# Patient Record
Sex: Female | Born: 1970 | Race: White | Hispanic: No | Marital: Single | Smoking: Current every day smoker
Health system: Southern US, Community
[De-identification: ages and names within clinical notes are randomized; demographics above are authoritative.]

## PROBLEM LIST (undated history)

## (undated) DIAGNOSIS — F17200 Nicotine dependence, unspecified, uncomplicated: Secondary | ICD-10-CM

## (undated) DIAGNOSIS — S52501A Unspecified fracture of the lower end of right radius, initial encounter for closed fracture: Secondary | ICD-10-CM

## (undated) DIAGNOSIS — D071 Carcinoma in situ of vulva: Secondary | ICD-10-CM

## (undated) HISTORY — PX: WISDOM TOOTH EXTRACTION: SHX21

## (undated) HISTORY — PX: WRIST FRACTURE SURGERY: SHX121

## (undated) HISTORY — PX: BRONCHOSCOPY: SUR163

## (undated) HISTORY — PX: COLPOSCOPY: SHX161

## (undated) HISTORY — DX: Carcinoma in situ of vulva: D07.1

---

## 1999-03-02 ENCOUNTER — Other Ambulatory Visit: Admission: RE | Admit: 1999-03-02 | Discharge: 1999-03-02 | Payer: Self-pay | Admitting: *Deleted

## 2001-02-27 ENCOUNTER — Other Ambulatory Visit: Admission: RE | Admit: 2001-02-27 | Discharge: 2001-02-27 | Payer: Self-pay | Admitting: *Deleted

## 2003-02-23 ENCOUNTER — Other Ambulatory Visit: Admission: RE | Admit: 2003-02-23 | Discharge: 2003-02-23 | Payer: Self-pay | Admitting: Obstetrics and Gynecology

## 2004-03-31 ENCOUNTER — Other Ambulatory Visit: Admission: RE | Admit: 2004-03-31 | Discharge: 2004-03-31 | Payer: Self-pay | Admitting: Obstetrics and Gynecology

## 2006-12-14 ENCOUNTER — Ambulatory Visit (HOSPITAL_COMMUNITY): Admission: RE | Admit: 2006-12-14 | Discharge: 2006-12-14 | Payer: Self-pay | Admitting: Obstetrics & Gynecology

## 2007-12-06 ENCOUNTER — Encounter (INDEPENDENT_AMBULATORY_CARE_PROVIDER_SITE_OTHER): Payer: Self-pay | Admitting: Obstetrics & Gynecology

## 2007-12-06 ENCOUNTER — Ambulatory Visit (HOSPITAL_COMMUNITY): Admission: RE | Admit: 2007-12-06 | Discharge: 2007-12-06 | Payer: Self-pay | Admitting: Obstetrics & Gynecology

## 2010-02-23 ENCOUNTER — Encounter: Admission: RE | Admit: 2010-02-23 | Discharge: 2010-02-23 | Payer: Self-pay | Admitting: Family Medicine

## 2010-04-04 ENCOUNTER — Encounter: Admission: RE | Admit: 2010-04-04 | Discharge: 2010-04-04 | Payer: Self-pay | Admitting: Family Medicine

## 2010-04-05 ENCOUNTER — Encounter: Payer: Self-pay | Admitting: Internal Medicine

## 2010-04-12 ENCOUNTER — Encounter: Payer: Self-pay | Admitting: Internal Medicine

## 2010-04-18 ENCOUNTER — Encounter: Admission: RE | Admit: 2010-04-18 | Discharge: 2010-04-18 | Payer: Self-pay | Admitting: Family Medicine

## 2010-04-20 ENCOUNTER — Encounter: Payer: Self-pay | Admitting: Internal Medicine

## 2010-04-28 ENCOUNTER — Ambulatory Visit: Payer: Self-pay | Admitting: Internal Medicine

## 2010-04-28 DIAGNOSIS — R930 Abnormal findings on diagnostic imaging of skull and head, not elsewhere classified: Secondary | ICD-10-CM

## 2010-04-28 DIAGNOSIS — F17200 Nicotine dependence, unspecified, uncomplicated: Secondary | ICD-10-CM

## 2010-04-29 ENCOUNTER — Telehealth (INDEPENDENT_AMBULATORY_CARE_PROVIDER_SITE_OTHER): Payer: Self-pay | Admitting: *Deleted

## 2010-05-02 ENCOUNTER — Ambulatory Visit: Admission: RE | Admit: 2010-05-02 | Discharge: 2010-05-02 | Payer: Self-pay | Admitting: Internal Medicine

## 2010-05-03 ENCOUNTER — Telehealth (INDEPENDENT_AMBULATORY_CARE_PROVIDER_SITE_OTHER): Payer: Self-pay | Admitting: *Deleted

## 2010-05-05 ENCOUNTER — Ambulatory Visit: Payer: Self-pay | Admitting: Internal Medicine

## 2010-06-08 ENCOUNTER — Ambulatory Visit: Payer: Self-pay | Admitting: Internal Medicine

## 2010-11-22 ENCOUNTER — Other Ambulatory Visit: Payer: Self-pay | Admitting: Obstetrics & Gynecology

## 2010-11-22 NOTE — Letter (Signed)
Summary: Carolin Coy MD  Carolin Coy MD   Imported By: Sherian Rein 05/18/2010 09:42:19  _____________________________________________________________________  External Attachment:    Type:   Image     Comment:   External Document

## 2010-11-22 NOTE — Progress Notes (Signed)
Summary: cd  Phone Note Call from Patient Call back at Home Phone [phone]   Caller: Patient Call For: ramaswamy Summary of Call: pt wants to make sure that MR has her cd that she brought in (tests) says she doesn't need it, just wants to make sure that it was given to him.   Initial call taken by: Tivis Ringer, CNA,  May 03, 2010 2:23 PM  Follow-up for Phone Call        Please advise, thanks! Vernie Murders  May 03, 2010 2:57 PM   Additional Follow-up for Phone Call Additional follow up Details #1::        I gave CD to [facility] corodinator Recardo Evangelist has given to radiologyfor discussion on thursday. AFter that I should get it and then returnto patient Additional Follow-up by: Kalman Shan MD,  May 03, 2010 3:05 PM    Additional Follow-up for Phone Call Additional follow up Details #2::    Spoke with pt and advised of the above recs per MR.  Pt verbalized understanding. Follow-up by: Vernie Murders,  May 03, 2010 3:15 PM

## 2010-11-22 NOTE — Assessment & Plan Note (Signed)
Summary: pneumonia/jd   Visit Type:  Initial Consult Copy to:  Dr. Gerri Spore Primary Provider/Referring Provider:  Dr. Carolin Coy  CC:  Pulmonary consult. The patient c/o recurrent pneumonia. Brought DR chest on CD. No complaints today.Marland Kitchen  History of Present Illness: IOV 77/2011: 40 year old smoker. Went to [place] for 4 nights  and returned 02/07/2010. Reports alcohol binge  consumption but no loss of consciousness or aspiration. On 02/11/2010 was stretching arms as part of exercise and felt a sudden pull with severe pain in left infrascapular region. Took alleve and pain improved but was still having some "hurting" when lying on side and also deep breath. then around 02/14/2010 as pain startred improved developed a "hacking" dry severe cough. Because symptoms persisted saw PMD 02/23/2010. CXR showed Left Lower Lobe/Lingular infiltrate. Diagnosed as pneumonia. Was given antibiotics nos. Followed with PMD a week later. Reportedly exam showed some tightness. So, given another round of antibiotics. With this she improved by 03/02/2010 and felt nearly normal. She then went on vacation to [place], [place] over memorial day weekend. Felt fine. Went for followup on6/13 and CXR showed worsening Left Lower Lobe/Lingular infilrate. But asymptomatic at thhis time. Had CT chest 6/14 at [facility]. I have reviewd this. Official reports suggests consolidation of left lower lobe with small loculated effusion  (my impression see lab section). She was then placed on levaquin. Still asymptomatic. ESR on 6/21 hight at 81.  Followup CXR 6/27 showed progressive worsening of  infiltrate in lung.  Repeat ESR 6/29 was high 65 and WC 13k. Therefore referred here.   Currently feels fine but anxious about diagnosis. States she has very minimal to no chest symptoms other than very rare cough. No fever. No weight loss. No diaphoresis. Only has few chills very rarely. Going to work.  She has now quit smoking 3 days ago on  04/26/2010  Preventive Screening-Counseling & Management  Alcohol-Tobacco     Alcohol drinks/day: <1     Smoking Status: quit     Smoking Cessation Counseling: no     Smoke Cessation Stage: quit     Packs/Day: 1.0     Year Started: 1991     Year Quit: 2011     Pack years: 20     Tobacco Counseling: not to resume use of tobacco products  Comments: quit 04/26/2010 RX  wellbutrin 04/28/2010  Current Medications (verified): 1)  Depo-Provera 150 Mg/ml Susp (Medroxyprogesterone Acetate) .Marland Kitchen.. 1 Injection Every 3 Months 2)  Allegra-D 12 Hour 60-120 Mg Xr12h-Tab (Fexofenadine-Pseudoephedrine) .Marland Kitchen.. 1 By Mouth Two Times A Day As Needed  Allergies (verified): No Known Drug Allergies  Past History:  Past Medical History: #Denies DM, BP, Cancer, STroke, MI, CAD. #Snores but denies excess day time somnolence #BMI 31-32 #Tobaccao Abuse #tb SKIN TEST NEGATIVE - 6/21  Past Surgical History:  2009 - Colposcopy of cervix and vulva with biopsy of cervix at 6   and 9 o'clock proceed the and carbon dioxide laser fulguration of vulvar   lesions for Recurrent vulvar intraepithelial neoplasia III.   Family History: Family History Lung Cancer---MGM Family History MI/Heart Attack---MGF Family History Emphysema ---MGM Leukemia---PGF Thyroid cancer---father  Social History: Patient states former smoker. 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 1 dog at home No birds or catsAlcohol drinks/day:  <1 Smoking Status:  quit Packs/Day:  1.0 Pack years:  20  Review of Systems  The patient complains of shortness of breath with activity, non-productive cough, headaches, and nasal congestion/difficulty breathing through nose.  The patient denies shortness of breath at rest, productive cough, coughing up blood, chest pain, irregular heartbeats, acid heartburn, indigestion, loss of appetite,  weight change, abdominal pain, difficulty swallowing, sore throat, tooth/dental problems, sneezing, itching, ear ache, anxiety, depression, hand/feet swelling, joint stiffness or pain, rash, change in color of mucus, and fever.    Vital Signs:  Patient profile:   40 year old female Height:      68 inches (172.72 cm) Weight:      209 pounds (95.00 kg) BMI:     31.89 O2 Sat:      96 % on Room air Temp:     98.2 degrees F (36.78 degrees C) oral Pulse rate:   101 / minute BP sitting:   112 / 72  (right arm) Cuff size:   regular  Vitals Entered By: Michel Bickers CMA (April 28, 2010 4:01 PM)  O2 Sat at Rest %:  96 O2 Flow:  Room air CC: Pulmonary consult. The patient c/o recurrent pneumonia. Brought DR chest on CD. No complaints today. Comments Medications reviewed. Daytime phone verified. Michel Bickers CMA  April 28, 2010 4:01 PM   Physical Exam  General:  well developed, well nourished, in no acute distress Head:  normocephalic and atraumatic Eyes:  PERRLA/EOM intact; conjunctiva and sclera clear Ears:  TMs intact and clear with normal canals Nose:  no deformity, discharge, inflammation, or lesions Mouth:  no deformity or lesions Neck:  no masses, thyromegaly, or abnormal cervical nodes Chest Wall:  no deformities noted Lungs:  clear bilaterally to auscultation and percussion Heart:  regular rate and rhythm, S1, S2 without murmurs, rubs, gallops, or clicks Abdomen:  bowel sounds positive; abdomen soft and non-tender without masses, or organomegaly Msk:  no deformity or scoliosis noted with normal posture Pulses:  pulses normal Extremities:  no clubbing, cyanosis, edema, or deformity noted Neurologic:  CN II-XII grossly intact with normal reflexes, coordination, muscle strength and tone Skin:  intact without lesions or rashes Cervical Nodes:  no significant adenopathy Axillary Nodes:  no significant adenopathy Psych:  alert and cooperative; normal mood and affect; normal attention  span and concentration   EKG  Procedure date:  02/23/2010  Findings:        Clinical Data: Left-sided chest pain with cough and congestion,   smoking history    CHEST - 2 VIEW    Comparison: None    Findings: There is opacity at the left lung base most consistent   with pneumonia and possible effusion.  Some of this effusion may be   tracking into the major fissure.  Follow-up chest x-ray is   recommended to ensure clearing.  The right lung is well aerated.   The heart is within normal limits in size.  No mediastinal   abnormality is seen.  No bony abnormality is noted.    IMPRESSION:   Opacity at the left lung base most consistent with pneumonia and   left effusion.  Recommend follow-up to ensure clearing.    Read By:  Juline Patch,  M.D.   Released By:  Juline Patch,  M.D.   Comments:      personally reviewed  CXR  Procedure date:  04/18/2010  Findings:       Clinical Data: Low lung infiltrate. The patient reports a recent CT   scan was negative for tumor.  CHEST - 2 VIEW    Comparison: 04/04/2010,    Findings: Again noted is a prominent density in the lingula of the   left lung.  There is pleural thickening.  The extent of the density   has slightly increased with some fluid tracking along the major   fissure.    The right lung is clear.    Heart size and vascularity are normal.    IMPRESSION:   Progressive poorly defined abnormality in the lingula of the left   lung.  This may represent pneumonia.    Read By:  Gwynn Burly,  M.D.   Released By:  Gwynn Burly,  M.D.   Comments:      independently reviewed  CT of Chest  Procedure date:  04/05/2010  Findings:      CT =chest done in [facility]. No report. In lingula theere is parencyhmal triangular density with apex medially and with airbronchogram at apex. Distally near chest wall there is a different density to it. Findings are suggesitve of collapse/consolidation jn my  opinion  Impression & Recommendations:  Problem # 1:  NONSPCIFC ABN FINDING RAD & OTH EXAM LUNG FIELD (ICD-793.1) Assessment New Following trip to [place] in mid-april 2011 she appears to have progressive Left Lower Lobe wedge shaped consolidative process with airbronchograms. THere might be a pleural component as reported by 6/27 CT scan radiologist but I feel she predmoninantly has parenchymal process with differeing densitieis. Diferential diagnosis includes collapse due to foregin body aspiration (? sustained in [place]) versus endobronchial lesion (low pretest prob), chronic lung abscess, BOOP.  Today's CXR does not show any change from 6/27  PLAN I think first step to proceed with bronch for airway exam to rule out endobronchial issues. Wil do lavage as well to assess for cell count, microbiology. She has been advised of risks of bronch (sedaiton, bleeding, pneumothorax) and limitation (non diagnosis). SHe is wiling to proceed. Depending on bronch results, we might have to have CVTS evaluate her Orders: T-2 View CXR (71020TC) Consultation Level V (78938) Tobacco use cessation intensive >10 minutes (10175)  Problem # 2:  TOBACCO ABUSE (ICD-305.1) Assessment: New  She quit only 2 days ago. Pripor effort at quitting failed. Currently having withdrawals. REluctant to try chantix due to bad press. Reluctantly willing to try wellbutrin. I advised her of potential benefit and have given side effect profile about weelbutrin. She has taken script for it and will try wellbutrin  > 11 minutes counselling  Orders: Consultation Level V (10258) Tobacco use cessation intensive >10 minutes (52778)  Medications Added to Medication List This Visit: 1)  Depo-provera 150 Mg/ml Susp (Medroxyprogesterone acetate) .Marland Kitchen.. 1 injection every 3 months 2)  Allegra-d 12 Hour 60-120 Mg Xr12h-tab (Fexofenadine-pseudoephedrine) .Marland Kitchen.. 1 by mouth two times a day as needed 3)  Wellbutrin Sr 150 Mg Xr12h-tab (Bupropion hcl)  .... Take 1 tablet daily for 3 days and then take 1 tablet two times a day to continue  Patient Instructions: 1)  Please take wellbutrin for quitting smoking as directed 2)  Read the side effect profile on wellbutrin 3)  I can do bronchoscopy on  4)   a) monday 7/11 at 12:30pm 5)   b) tuesday or wed 7/12 and 7/13 9:30-12:30 but my ofice will have to block out 45 minutes of my office time for that 6)  Please wait to hear from office on 04/29/2010 about bronch schedule Prescriptions: WELLBUTRIN SR 150 MG XR12H-TAB (BUPROPION HCL) TAKE 1 TABLET  DAILY FOR 3 DAYS AND THEN TAKE 1 TABLET two times a day TO CONTINUE  #60 x 1   Entered and Authorized by:   Kalman Shan MD   Signed by:   Kalman Shan MD on 04/28/2010   Method used:   Print then Give to Patient   RxID:   [id number]

## 2010-11-22 NOTE — Assessment & Plan Note (Signed)
Summary: 1 MONTH/CB   Visit Type:  Follow-up Copy to:  Dr. Gerri Spore Primary Provider/Referring Provider:  Dr. Carolin Coy  CC:  pt here for 4 week follow-up. Margaret Perez  History of Present Illness: IOV 77/2011: 40 year old smoker. Went to [place] for 4 nights  and returned 02/07/2010. Reports alcohol binge  consumption but no loss of consciousness or aspiration. On 02/11/2010 was stretching arms as part of exercise and felt a sudden pull with severe pain in left infrascapular region. Took alleve and pain improved but was still having some "hurting" when lying on side and also deep breath. then around 02/14/2010 as pain startred improved developed a "hacking" dry severe cough. Because symptoms persisted saw PMD 02/23/2010. CXR showed Left Lower Lobe/Lingular infiltrate. Diagnosed as pneumonia. Was given antibiotics nos. Followed with PMD a week later. Reportedly exam showed some tightness. So, given another round of antibiotics. With this she improved by 03/02/2010 and felt nearly normal. She then went on vacation to [place], [place] over memorial day weekend. Felt fine. Went for followup on6/13 and CXR showed worsening Left Lower Lobe/Lingular infilrate. But asymptomatic at thhis time. Had CT chest 6/14 at [facility]. I have reviewd this. Official reports suggests consolidation of left lower lobe with small loculated effusion  (my impression see lab section). She was then placed on levaquin. Still asymptomatic. ESR on 6/21 hight at 81.  Followup CXR 6/27 showed progressive worsening of  infiltrate in lung.  Repeat ESR 6/29 was high 65 and WC 13k. Therefore referred here.   Currently feels fine but anxious about diagnosis. States she has very minimal to no chest symptoms other than very rare cough. No fever. No weight loss. No diaphoresis. Only has few chills very rarely. Going to work.  She has now quit smoking 3 days ago on 04/26/2010  REC: BRONCH   #OV 05/05/2010: Followup. Is s/p bronch 05/02/2010. Had Left  Lowe lobe BAL, Brush, and TBBx along wiht airway exam . All non-diagnostic. REsults reviewed at [facility] conference this morning. 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 Watch and wait approach suggested. She is here to review results. No symptoms since last visit. In terms of smoking, she has relapsed. She smoked today. She is taking wellbutrin but has not set a quit date. REC: 3 week augmentin  #PV 06/08/2010: Followup for LLL consolidation/lung abscess and tobacco abuse. She finished 3 week agumentin course  1 week ago. Tolerated it well. Still has sensation of tightness in left chest infra-axillary region with deep inspiration. She insists this is not a pain. Feels well otherwise. Denies active complaints.Still smokng though and is struggling to quit; wants to continue wellbutrin   Preventive Screening-Counseling & Management  Alcohol-Tobacco     Smoking Status: current     Smoking Cessation Counseling: yes     Smoke Cessation Stage: contemplative     Tobacco Counseling: to quit use of tobacco products  Comments: continue wellbutrin. advised to start patch.   Current Medications (verified): 1)  Depo-Provera 150 Mg/ml Susp (Medroxyprogesterone Acetate) .Margaret Perez.. 1 Injection Every 3 Months 2)  Allegra-D 12 Hour 60-120 Mg Xr12h-Tab (Fexofenadine-Pseudoephedrine) .Margaret Perez.. 1 By Mouth Two Times A Day As Needed 3)  Wellbutrin Sr 150 Mg Xr12h-Tab (Bupropion Hcl) .... Take 1 Tablet By Mouth Once A Day  Allergies (verified): No Known Drug Allergies  Past History:  Past medical, surgical, family and social histories (including risk factors) reviewed, and no changes noted (except as noted below).  Past Medical History: Reviewed history from 04/28/2010 and  no changes required. #Denies DM, BP, Cancer, STroke, MI, CAD. #Snores but denies excess day time somnolence #BMI 31-32 #[place] Abuse #tb SKIN TEST NEGATIVE - 6/21  Past Surgical History: Reviewed history from 04/28/2010 and no changes  required.  2009 - Colposcopy of cervix and vulva with biopsy of cervix at 6   and 9 o'clock proceed the and carbon dioxide laser fulguration of vulvar   lesions for Recurrent vulvar intraepithelial neoplasia III.   Family History: Reviewed history from 04/28/2010 and no changes required. Family History Lung Cancer---MGM Family History MI/Heart Attack---MGF Family History Emphysema ---MGM Leukemia---PGF Thyroid cancer---father  Social History: Reviewed history from 04/28/2010 and no changes required. Patient states former smoker. Quit 04/26/2010. Started back smoking aug 1st.  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 1 dog at home No birds or cats  Review of Systems  The patient denies shortness of breath with activity, shortness of breath at rest, productive cough, non-productive cough, coughing up blood, chest pain, irregular heartbeats, acid heartburn, indigestion, loss of appetite, weight change, abdominal pain, difficulty swallowing, sore throat, tooth/dental problems, headaches, nasal congestion/difficulty breathing through nose, sneezing, itching, ear ache, anxiety, depression, hand/feet swelling, joint stiffness or pain, rash, change in color of mucus, and fever.    Vital Signs:  Patient profile:   40 year old female Height:      68 inches Weight:      210.38 pounds O2 Sat:      98 % on Room air Temp:     98.0 degrees F oral Pulse rate:   101 / minute BP sitting:   136 / 90  (right arm) Cuff size:   regular  Vitals Entered By: Carron Curie CMA (June 08, 2010 11:50 AM)  O2 Flow:  Room air CC: pt here for 4 week follow-up.  Comments Medications reviewed with patient .sgin Daytime phone number verified with patient.    Physical Exam  General:  well developed, well nourished, in no acute distress Head:  normocephalic and atraumatic Eyes:  PERRLA/EOM intact;  conjunctiva and sclera clear Ears:  TMs intact and clear with normal canals Nose:  no deformity, discharge, inflammation, or lesions Mouth:  no deformity or lesions Neck:  no masses, thyromegaly, or abnormal cervical nodes Chest Wall:  no deformities noted Lungs:  clear bilaterally to auscultation and percussion Heart:  regular rate and rhythm, S1, S2 without murmurs, rubs, gallops, or clicks Abdomen:  bowel sounds positive; abdomen soft and non-tender without masses, or organomegaly Msk:  no deformity or scoliosis noted with normal posture Pulses:  pulses normal Extremities:  no clubbing, cyanosis, edema, or deformity noted Neurologic:  CN II-XII grossly intact with normal reflexes, coordination, muscle strength and tone Skin:  intact without lesions or rashes Cervical Nodes:  no significant adenopathy Axillary Nodes:  no significant adenopathy Psych:  alert and cooperative; normal mood and affect; normal attention span and concentration   CXR  Procedure date:  06/08/2010  Findings:      significant clearnce of LLL consolidative process since earlyjuly 2011  Comments:      independent review of film. official report pending  Impression & Recommendations:  Problem # 1:  NONSPCIFC ABN FINDING RAD & OTH EXAM LUNG FIELD (ICD-793.1) Assessment Improved Significant clearing of LLL lung abscess on cxr. s/p 3 week augmentin Rx july - aug 2011.  plan repeat cxr 8 weeks to ensure complete clearance Orders: T-2 View  CXR (71020TC) Est. Patient Level III (13086)  Problem # 2:  TOBACCO ABUSE (ICD-305.1) Assessment: Unchanged  She has relapsed into smoking. Counselled her again about quitting. She is already on wellbutrin. She wants to conitnue it. She wants something additional ADvised nicotine patch.  She verbalized understanding and has promised to set quit date and quit smoking this week She refused joining quit smoking classes  5 minutes counselling  Orders: Est. Patient  Level III (57846) Tobacco use cessation intermediate 3-10 minutes (96295)  Medications Added to Medication List This Visit: 1)  Wellbutrin Sr 150 Mg Xr12h-tab (Bupropion hcl) .... Take 1 tablet by mouth bid  Patient Instructions: 1)  #Pneumonia 2)  - your xray is better 3)  #SMOKING 4)    - you got to quit 5)   - continue wellbutrin 6)    - start otc patch nictoine 7)  #FOllowup 8)   - return 8 weeks with CXR 9)   - come sooner if there are problems Prescriptions: WELLBUTRIN SR 150 MG XR12H-TAB (BUPROPION HCL) Take 1 tablet by mouth bid  #60 x 2   Entered and Authorized by:   Kalman Shan MD   Signed by:   Kalman Shan MD on 06/08/2010   Method used:   Electronically to        [facility]  [phone]* (retail)       [address] [place], [place]  [address]       Ph: [phone] or [phone]       Fax: [phone]   RxID:   [id number]

## 2010-11-22 NOTE — Progress Notes (Signed)
Summary: Bronchoscopy scheduled  Phone Note Outgoing Call   Call placed by: Michel Bickers CMA,  April 29, 2010 9:37 AM Call placed to: Patient Summary of Call: Bronchoscopy is scheduled for Monday, July 11th @ 12:30pm. The patient was instructed to arrive @ [facility] admiting by 11:15am, NPO after midnight and someone will need to come with her to drive her home. The patient had verbalized understanding of all instructions. Dr. Marchelle Gearing was given date and time of procedure. Initial call taken by: Michel Bickers CMA,  April 29, 2010 9:39 AM

## 2010-11-22 NOTE — Assessment & Plan Note (Signed)
Summary: Margaret Perez   Visit Type:  Follow-up Copy to:  Dr. Gerri Spore Primary Provider/Referring Provider:  Dr. Carolin Coy   History of Present Illness: IOV 77/2011: 40 year old smoker. Went to [place] for 4 nights  and returned 02/07/2010. Reports alcohol binge  consumption but no loss of consciousness or aspiration. On 02/11/2010 was stretching arms as part of exercise and felt a sudden pull with severe pain in left infrascapular region. Took alleve and pain improved but was still having some "hurting" when lying on side and also deep breath. then around 02/14/2010 as pain startred improved developed a "hacking" dry severe cough. Because symptoms persisted saw PMD 02/23/2010. CXR showed Left Lower Lobe/Lingular infiltrate. Diagnosed as pneumonia. Was given antibiotics nos. Followed with PMD a week later. Reportedly exam showed some tightness. So, given another round of antibiotics. With this she improved by 03/02/2010 and felt nearly normal. She then went on vacation to [place], [place] over memorial day weekend. Felt fine. Went for followup on6/13 and CXR showed worsening Left Lower Lobe/Lingular infilrate. But asymptomatic at thhis time. Had CT chest 6/14 at [facility]. I have reviewd this. Official reports suggests consolidation of left lower lobe with small loculated effusion  (my impression see lab section). She was then placed on levaquin. Still asymptomatic. ESR on 6/21 hight at 81.  Followup CXR 6/27 showed progressive worsening of  infiltrate in lung.  Repeat ESR 6/29 was high 65 and WC 13k. Therefore referred here.   Currently feels fine but anxious about diagnosis. States she has very minimal to no chest symptoms other than very rare cough. No fever. No weight loss. No diaphoresis. Only has few chills very rarely. Going to work.  She has now quit smoking 3 days ago on 04/26/2010  REC: BRONCHOV 05/05/2010: Followup. Is s/p bronch 05/02/2010. Had Left Lowe lobe BAL, Brush, and TBBx along wiht  airway exam . All non-diagnostic. REsults reviewed at [facility] conference this morning. 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 Watch and wait approach suggested. She is here to review results. No symptoms since last visit. In terms of smoking, she has relapsed. She smoked today. She is taking wellbutrin but has not set a quit date.   Preventive Screening-Counseling & Management  Alcohol-Tobacco     Alcohol drinks/day: <1     Smoking Status: current     Smoking Cessation Counseling: no     Smoke Cessation Stage: relapse     Packs/Day: 1.0     Year Started: 1991     Year Quit: 2011     Pack years: 20     Tobacco Counseling: to quit use of tobacco products  Comments: relapsed this week. ADvised to contnue wellbutrin started last week but set a quit date for quitting smoking  Current Medications (verified): 1)  Depo-Provera 150 Mg/ml Susp (Medroxyprogesterone Acetate) .Marland Kitchen.. 1 Injection Every 3 Months 2)  Allegra-D 12 Hour 60-120 Mg Xr12h-Tab (Fexofenadine-Pseudoephedrine) .Marland Kitchen.. 1 By Mouth Two Times A Day As Needed 3)  Wellbutrin Sr 150 Mg Xr12h-Tab (Bupropion Hcl) .... Take 1 Tablet Daily For 3 Days and Then Take 1 Tablet Two Times A Day To Continue  Allergies (verified): No Known Drug Allergies  Past History:  Past Medical History: Last updated: 04/28/2010 #Denies DM, BP, Cancer, STroke, MI, CAD. #Snores but denies excess day time somnolence #BMI 31-32 #[place] Abuse #tb SKIN TEST NEGATIVE - 6/21  Past Surgical History: Last updated: 04/28/2010  2009 - Colposcopy of cervix and vulva with biopsy of cervix at  6   and 9 o'clock proceed the and carbon dioxide laser fulguration of vulvar   lesions for Recurrent vulvar intraepithelial neoplasia III.   Family History: Last updated: 04/28/2010 Family History Lung Cancer---MGM Family History MI/Heart Attack---MGF Family History Emphysema ---MGM Leukemia---PGF Thyroid cancer---father  Social History: Last updated:  04/28/2010 Patient states former smoker. 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 1 dog at home No birds or cats  Risk Factors: Alcohol Use: <1 (05/05/2010)  Risk Factors: Smoking Status: current (05/05/2010) Packs/Day: 1.0 (05/05/2010)  Family History: Reviewed history from 04/28/2010 and no changes required. Family History Lung Cancer---MGM Family History MI/Heart Attack---MGF Family History Emphysema ---MGM Leukemia---PGF Thyroid cancer---father  Social History: Reviewed history from 04/28/2010 and no changes required. Patient states former smoker. 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 1 dog at home No birds or catsSmoking Status:  current  Review of Systems  The patient denies anorexia, fever, weight loss, weight gain, vision loss, decreased hearing, hoarseness, chest pain, syncope, dyspnea on exertion, peripheral edema, prolonged cough, headaches, hemoptysis, abdominal pain, melena, hematochezia, severe indigestion/heartburn, hematuria, incontinence, genital sores, muscle weakness, suspicious skin lesions, transient blindness, difficulty walking, depression, unusual weight change, abnormal bleeding, enlarged lymph nodes, angioedema, breast masses, and testicular masses.    Vital Signs:  Patient profile:   40 year old female O2 Sat:      97 % on Room air Pulse rate:   106 / minute Resp:     20 per minute BP sitting:   138 / 85  O2 Flow:  Room air  Physical Exam  General:  well developed, well nourished, in no acute distress Head:  normocephalic and atraumatic Eyes:  PERRLA/EOM intact; conjunctiva and sclera clear Ears:  TMs intact and clear with normal canals Nose:  no deformity, discharge, inflammation, or lesions Mouth:  no deformity or  lesions Neck:  no masses, thyromegaly, or abnormal cervical nodes Chest Wall:  no deformities noted Lungs:  clear bilaterally to auscultation and percussion Heart:  regular rate and rhythm, S1, S2 without murmurs, rubs, gallops, or clicks Abdomen:  bowel sounds positive; abdomen soft and non-tender without masses, or organomegaly Msk:  no deformity or scoliosis noted with normal posture Pulses:  pulses normal Extremities:  no clubbing, cyanosis, edema, or deformity noted Neurologic:  CN II-XII grossly intact with normal reflexes, coordination, muscle strength and tone Skin:  intact without lesions or rashes Cervical Nodes:  no significant adenopathy Axillary Nodes:  no significant adenopathy Psych:  alert and cooperative; normal mood and affect; normal attention span and concentration   MISC. Report  Procedure date:  05/02/2010  Findings:      bronch LLL - BAL, Brush and Tbbx  - nondiagnostic. All suggestive of inflammatory cells  Impression & Recommendations:  Problem # 1:  TOBACCO ABUSE (ICD-305.1) Assessment Deteriorated  She has relapsed into smoking. Counselled her again about quitting. She is already on wellbutrin. INformed her that healing of abscess will be complicated. She verbalized understanding and has promised to set quit date and quit smoking this week  5 minutes counselling  Orders: Est. Patient Level IV (16109)  Problem # 2:  NONSPCIFC ABN FINDING RAD & OTH EXAM LUNG FIELD (ICD-793.1) Assessment: Unchanged  Following trip to [place] in mid-april 2011 she appears to have progressive Left Lower Lobe wedge  shaped consolidative process with airbronchograms. THere might be a pleural component as reported by 6/27 CT scan radiologist but I feel she predmoninantly has parenchymal process with differeing densitieis. Diferential diagnosis includes collapse due to foregin body aspiration (? sustained in [place]) versus endobronchial lesion (low pretest prob), chronic lung  abscess, BOOP.  Today's CXR does not show any change from 6/27  s.p BRONCH 05/02/2010 - BAL,. TTBx and brush - Nondiagnostic  Margaret discussion 05/05/2010 - cw abscess. REcommend conservative care  PLAN Augmentin 875mg  by mouth two times a day x 3 weeks (Reviewed - she has no allergies to PCN.Risk of C Diff diarrhea warned) Followup 4 weeks with CXR at pulmonary office  Orders: Est. Patient Level IV (16109) Prescription Created Electronically [phone])  Medications Added to Medication List This Visit: 1)  Augmentin 875-125 Mg Tabs (Amoxicillin-pot clavulanate) .... By mouth twice daily  Patient Instructions: 1)  take augmentin 875 mg by mouth two times a day x 3 weeks 2)  return to see me in 4 weeks at [facility] 3)  will do cxr when you return 4)  in between if you have any problems call or come sooner 5)  good luck 6)  continue wellbutrin 7)  quit smoking PLEASE Prescriptions: AUGMENTIN 875-125 MG  TABS (AMOXICILLIN-POT CLAVULANATE) By mouth twice daily  #42 x 0   Entered and Authorized by:   Kalman Shan MD   Signed by:   Kalman Shan MD on 05/05/2010   Method used:   Electronically to        [facility]  [phone]* (retail)       [address] [place], [place]  [address]       Ph: [phone] or [phone]       Fax: [phone]   RxID:   [id number]   Appended Document: Margaret Perez This morning Dr. Gwynn Burly radiologist commented about possible  Rt breast soft tissue shadow. I forgot to discuss this with patient.  HAve called her to call me back. She will need to talk to PMD about it  Appended Document: Margaret Perez she called back. Explained findins. She said " I need to talk to my primary doctor and have a mammogram anyways". Told her to talk to PMD

## 2011-01-08 LAB — LEGIONELLA PROFILE(CULTURE+DFA/SMEAR)
Legionella Antigen (DFA): NEGATIVE
Special Requests: ABNORMAL

## 2011-01-08 LAB — CULTURE, RESPIRATORY W GRAM STAIN
Culture: NO GROWTH
Special Requests: ABNORMAL

## 2011-01-08 LAB — BODY FLUID CELL COUNT WITH DIFFERENTIAL
Lymphs, Fluid: 5 %
Other Cells, Fluid: 0 %
Total Nucleated Cell Count, Fluid: 455 cu mm (ref 0–1000)

## 2011-01-08 LAB — AFB CULTURE WITH SMEAR (NOT AT ARMC): Acid Fast Smear: NONE SEEN

## 2011-01-08 LAB — PNEUMOCYSTIS JIROVECI SMEAR BY DFA

## 2011-01-08 LAB — FUNGAL STAIN

## 2011-01-08 LAB — PATHOLOGIST SMEAR REVIEW
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
## 2011-03-10 NOTE — Op Note (Signed)
Margaret Perez, Margaret Perez                ACCOUNT NO.:  [id number]   MEDICAL RECORD NO.:  [id number]          PATIENT TYPE:  AMB   LOCATION:  SDC                           [facility]ILITY:  [facility]   PHYSICIAN:  [name], M.D.DATE OF BIRTH:  1971-07-11   DATE OF PROCEDURE:  12/14/2006  DATE OF DISCHARGE:                               OPERATIVE REPORT   PREOPERATIVE DIAGNOSES:  1. VIN-III carcinoma in situ.  2. Vulvar condylomas.   POSTOPERATIVE DIAGNOSES:  1. VIN-III carcinoma in situ.  2. Vulvar condylomas.  3. Perianal condylomas.   PROCEDURE:  Colposcopy of cervix, vagina, vulva and perianal region and  CO2 laser vaporization of vulvar and perianal lesions.   SURGEON:  Dr. [name].   ANESTHESIOLOGIST:  Raul Del, M.D.   PROCEDURE:  Under general anesthesia with endotracheal intubation, the  patient is draped as usual and wet green towels are added proximal to  the operative field.  We then proceed with colposcopy using acidic acid.  We put the speculum in place.  The cervix is well visualized with the  colposcope as well as all surfaces of the vagina.  No lesion is seen at  that level.  We then proceed with a colposcopy of the vulva and perianal  areas.  On the right mid vulva where the biopsy was done VIN-III is  probably present.  All other areas looked milder or have a condylomatous  appearance.  We proceed with CO2 laser vaporization of all the lesions  present on the vulva and on the perianal area.  We start where the most  severe dysplasia is present on the right vulva and use the CO2 laser at  20 watts.  We then decrease it to 10 watts to control hemostasis at that  level and reapproximate the skin with separate stitches of Vicryl 4-0  which also complete hemostasis.  All other areas are smaller and not as  deep, therefore no closure is done.  Hemostasis is adequate at all  levels.  The estimated blood loss was minimal.  No complications  occurred and the  patient was transferred in good status to the recovery  room.  A cream of Nitrite Sulfate is applied on the vulva.  We will also  use Xylocaine 2% gel to reduce the patient's pain before discharge from  the hospital.      [name], M.D.  Electronically Signed     ML/MEDQ  D:  12/14/2006  T:  12/14/2006  Job:  [id number]

## 2011-07-14 LAB — CBC
HCT: 46.4 — ABNORMAL HIGH
MCHC: 34.9
MCV: 87.5
Platelets: 262
RBC: 5.3 — ABNORMAL HIGH
RDW: 13.8
WBC: 9.1

## 2015-08-21 ENCOUNTER — Emergency Department (HOSPITAL_COMMUNITY): Payer: BLUE CROSS/BLUE SHIELD

## 2015-08-21 ENCOUNTER — Emergency Department (HOSPITAL_COMMUNITY)
Admission: EM | Admit: 2015-08-21 | Discharge: 2015-08-22 | Disposition: A | Discharge status: Home / Self Care | Payer: BLUE CROSS/BLUE SHIELD | Attending: Emergency Medicine | Admitting: Emergency Medicine

## 2015-08-21 ENCOUNTER — Encounter (HOSPITAL_COMMUNITY): Payer: Self-pay | Admitting: Emergency Medicine

## 2015-08-21 DIAGNOSIS — F17210 Nicotine dependence, cigarettes, uncomplicated: Secondary | ICD-10-CM | POA: Insufficient documentation

## 2015-08-21 DIAGNOSIS — W1839XA Other fall on same level, initial encounter: Secondary | ICD-10-CM | POA: Diagnosis not present

## 2015-08-21 DIAGNOSIS — Y9389 Activity, other specified: Secondary | ICD-10-CM | POA: Diagnosis not present

## 2015-08-21 DIAGNOSIS — S52501A Unspecified fracture of the lower end of right radius, initial encounter for closed fracture: Secondary | ICD-10-CM | POA: Insufficient documentation

## 2015-08-21 DIAGNOSIS — Y9289 Other specified places as the place of occurrence of the external cause: Secondary | ICD-10-CM | POA: Diagnosis not present

## 2015-08-21 DIAGNOSIS — Y998 Other external cause status: Secondary | ICD-10-CM | POA: Diagnosis not present

## 2015-08-21 DIAGNOSIS — S5291XA Unspecified fracture of right forearm, initial encounter for closed fracture: Secondary | ICD-10-CM

## 2015-08-21 DIAGNOSIS — S6991XA Unspecified injury of right wrist, hand and finger(s), initial encounter: Secondary | ICD-10-CM | POA: Diagnosis present

## 2015-08-21 MED ORDER — SODIUM CHLORIDE 0.9 % IV SOLN
Freq: Once | INTRAVENOUS | Status: AC
  Administered 2015-08-21: 23:00:00 via INTRAVENOUS

## 2015-08-21 MED ORDER — LIDOCAINE HCL 1 % IJ SOLN
INTRAMUSCULAR | Status: AC
  Filled 2015-08-21: qty 20

## 2015-08-21 MED ORDER — ONDANSETRON HCL 4 MG/2ML IJ SOLN
4.0000 mg | Freq: Once | INTRAMUSCULAR | Status: AC
  Administered 2015-08-21: 4 mg via INTRAVENOUS
  Filled 2015-08-21: qty 2

## 2015-08-21 MED ORDER — LIDOCAINE HCL 1 % IJ SOLN
30.0000 mL | Freq: Once | INTRAMUSCULAR | Status: AC
  Administered 2015-08-22: 30 mL
  Filled 2015-08-21: qty 40

## 2015-08-21 MED ORDER — MORPHINE SULFATE (PF) 4 MG/ML IV SOLN
4.0000 mg | Freq: Once | INTRAVENOUS | Status: AC
  Administered 2015-08-21: 4 mg via INTRAVENOUS
  Filled 2015-08-21: qty 1

## 2015-08-21 NOTE — ED Notes (Signed)
Pt states she was standing up and was trying to "be cool and turned" and lost her balance. Pt states she has had about 4-5 drinks today.
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
## 2015-08-21 NOTE — ED Notes (Signed)
Bed: WTR5 Expected date:  Expected time:  Means of arrival:  Comments: 

## 2015-08-22 ENCOUNTER — Emergency Department (HOSPITAL_COMMUNITY): Payer: BLUE CROSS/BLUE SHIELD

## 2015-08-22 MED ORDER — HYDROCODONE-ACETAMINOPHEN 5-325 MG PO TABS: 1.0000 | ORAL_TABLET | ORAL | Status: DC | PRN

## 2015-08-22 NOTE — Discharge Instructions (Signed)
Forearm Fracture A forearm fracture is a break in one or both of the bones of your arm that are between the elbow and the wrist. Your forearm is made up of two bones:  Radius. This is the bone on the inside of your arm near your thumb.  Ulna. This is the bone on the outside of your arm near your little finger. Middle forearm fractures usually break both the radius and the ulna. Most forearm fractures that involve both the ulna and radius will require surgery. CAUSES Common causes of this type of fracture include:  Falling on an outstretched arm.  Accidents, such as a car or bike accident.  A hard, direct hit to the middle part of your arm. RISK FACTORS You may be at higher risk for this type of fracture if:  You play contact sports.  You have a condition that causes your bones to be weak or thin (osteoporosis). SIGNS AND SYMPTOMS A forearm fracture causes pain immediately after the injury. Other signs and symptoms include:  An abnormal bend or bump in your arm (deformity).  Swelling.  Numbness or tingling.  Tenderness.  Inability to turn your hand from side to side (rotate).  Bruising. DIAGNOSIS Your health care provider may diagnose a forearm fracture based on:  Your symptoms.  Your medical history, including any recent injury.  A physical exam. Your health care provider will look for any deformity and feel for tenderness over the break. Your health care provider will also check whether the bones are out of place.  An X-ray exam to confirm the diagnosis and learn more about the type of fracture. TREATMENT The goals of treatment are to get the bone or bones in proper position for healing and to keep the bones from moving so they will heal over time. Your treatment will depend on many factors, especially the type of fracture that you have.  If the fractured bone or bones:  Are in the correct position (nondisplaced), you may only need to wear a cast or a  splint.  Have a slightly displaced fracture, you may need to have the bones moved back into place manually (closed reduction) before the splint or cast is put on.  You may have a temporary splint before you have a cast. The splint allows room for some swelling. After a few days, a cast can replace the splint.  You may have to wear the cast for 6-8 weeks or as directed by your health care provider.  The cast may be changed after about 3 weeks or as directed by your health care provider.  After your cast is removed, you may need physical therapy to regain full movement in your wrist or elbow.  You may need emergency surgery if you have:  A fractured bone or bones that are out of position (displaced).  A fracture with multiple fragments (comminuted fracture).  A fracture that breaks the skin (open fracture). This type of fracture may require surgical wires, plates, or screws to hold the bone or bones in place.  You may have X-rays every couple of weeks to check on your healing. HOME CARE INSTRUCTIONS If You Have a Cast:  Do not stick anything inside the cast to scratch your skin. Doing that increases your risk of infection.  Check the skin around the cast every day. Report any concerns to your health care provider. You may put lotion on dry skin around the edges of the cast. Do not apply lotion to the skin  underneath the cast. If You Have a Splint:  Wear it as directed by your health care provider. Remove it only as directed by your health care provider.  Loosen the splint if your fingers become numb and tingle, or if they turn cold and blue. Bathing  Cover the cast or splint with a watertight plastic bag to protect it from water while you bathe or shower. Do not let the cast or splint get wet. Managing Pain, Stiffness, and Swelling  If directed, apply ice to the injured area:  Put ice in a plastic bag.  Place a towel between your skin and the bag.  Leave the ice on for 20  minutes, 2-3 times a day.  Move your fingers often to avoid stiffness and to lessen swelling.  Raise the injured area above the level of your heart while you are sitting or lying down. Driving  Do not drive or operate heavy machinery while taking pain medicine.  Do not drive while wearing a cast or splint on a hand that you use for driving. Activity  Return to your normal activities as directed by your health care provider. Ask your health care provider what activities are safe for you.  Perform range-of-motion exercises only as directed by your health care provider. Safety  Do not use your injured limb to support your body weight until your health care provider says that you can. General Instructions  Do not put pressure on any part of the cast or splint until it is fully hardened. This may take several hours.  Keep the cast or splint clean and dry.  Do not use any tobacco products, including cigarettes, chewing tobacco, or electronic cigarettes. Tobacco can delay bone healing. If you need help quitting, ask your health care provider.  Take medicines only as directed by your health care provider.  Keep all follow-up visits as directed by your health care provider. This is important. SEEK MEDICAL CARE IF:  Your pain medicine is not helping.  Your cast or splint becomes wet or damaged or suddenly feels too tight.  Your cast becomes loose.  You have more severe pain or swelling than you did before the cast.  You have severe pain when you stretch your fingers.  You continue to have pain or stiffness in your elbow or your wrist after your cast is removed. SEEK IMMEDIATE MEDICAL CARE IF:  You cannot move your fingers.  You lose feeling in your fingers or your hand.  Your hand or your fingers turn cold and pale or blue.  You notice a bad smell coming from your cast.  You have drainage from underneath your cast.  You have new stains from blood or drainage that is coming  through your cast.   This information is not intended to replace advice given to you by your health care provider. Make sure you discuss any questions you have with your health care provider.   Document Released: 10/06/2000 Document Revised: 10/30/2014 Document Reviewed: 05/25/2014 Elsevier Interactive Patient Education 2016 [employer] Call the office to set an appointment

## 2015-08-22 NOTE — Consult Note (Signed)
  Margaret Perez is an 44 y.o. female.   Chief Complaint: right distal radius fracture HPI: 44 yo rhd female states she fell from standing height onto right arm this evening.  Visible deformity at wrist.  [facility] revealed right distal radius fracture with dorsal angulation.  She reports previous forearm fracture at 44 years old, but no previous wrist injury and no other injury at this time.  History reviewed. No pertinent past medical history.  Past Surgical History  Procedure Laterality Date  . Bronchoscopy      No family history on file. Social History:  reports that she has been smoking Cigarettes.  She has been smoking about 1.00 pack per day. She does not have any smokeless tobacco history on file. She reports that she drinks about 12.6 oz of alcohol per week. She reports that she does not use illicit drugs.  Allergies: No Known Allergies   (Not in a hospital admission)  No results found for this or any previous visit (from the past 48 hour(s)).  Dg Wrist Complete Right  08/21/2015  CLINICAL DATA:  Distal radial pain today, obvious swelling at site of distal radius. Status post fall. EXAM: RIGHT WRIST - COMPLETE 3+ VIEW COMPARISON:  None. FINDINGS: Displaced/comminuted fracture of the distal right radius. At least mild subluxation of the distal ulna. Slightly displaced fracture of the ulnar styloid process. No fracture or dislocation seen amongst the carpal bones or proximal metacarpal bones. IMPRESSION: Displaced/comminuted fracture of the distal right radius. Associated dorsal tilt at the radiocarpal joint space. At least mild subluxation of the distal left ulna, suspicious for associated ligamentous injury. Also slightly displaced fracture of the ulnar styloid process. No fracture or dislocation within the carpal bones. Electronically Signed   By: Bary RichardStan  Maynard M.D.   On: 08/21/2015 22:42     A comprehensive review of systems was negative.  Blood pressure 141/91,  pulse 78, temperature 98.2 F (36.8 C), temperature source Oral, resp. rate 18, height 5' 8.5" (1.74 m), weight 74.844 kg (165 lb), SpO2 99 %.  General appearance: alert, cooperative and appears stated age Head: Normocephalic, without obvious abnormality, atraumatic Neck: supple, symmetrical, trachea midline Extremities: intact sensation and capillary refill all digits.  +epl/fpl/io.  no wounds.  compartments soft.  visible deformity right wrist. Pulses: 2+ and symmetric Skin: Skin color, texture, turgor normal. No rashes or lesions Neurologic: Grossly normal Incision/Wound: none  Assessment/Plan Right distal radius fracture with dorsal angulation.  Discussed treatment options.  Recommend closed reduction in ED with follow up in office for surgical planning.  Risks, benefits, and alternatives of reduction were discussed and the patient agrees with the plan of care.  Procedure note:  Hematoma block performed with 10 mL of 1% plain lidocaine.  Adequate to give anesthesia at fracture site.  Closed reduction of right wrist performed.  Sugartong splint placed.  Improved alignment and comfort after reduction.  Post reduction radiographs show improved alignment.  Follow up in office this week.  Pain meds per ED.   Jep Dyas R 08/22/2015, 12:32 AM

## 2015-08-24 ENCOUNTER — Other Ambulatory Visit: Payer: Self-pay | Admitting: Orthopedic Surgery

## 2015-08-25 ENCOUNTER — Encounter (HOSPITAL_BASED_OUTPATIENT_CLINIC_OR_DEPARTMENT_OTHER): Payer: Self-pay | Admitting: *Deleted

## 2015-08-27 ENCOUNTER — Encounter (HOSPITAL_BASED_OUTPATIENT_CLINIC_OR_DEPARTMENT_OTHER): Payer: Self-pay | Admitting: *Deleted

## 2015-08-27 ENCOUNTER — Ambulatory Visit (HOSPITAL_BASED_OUTPATIENT_CLINIC_OR_DEPARTMENT_OTHER): Payer: BLUE CROSS/BLUE SHIELD | Admitting: Anesthesiology

## 2015-08-27 ENCOUNTER — Ambulatory Visit (HOSPITAL_BASED_OUTPATIENT_CLINIC_OR_DEPARTMENT_OTHER)
Admission: RE | Admit: 2015-08-27 | Discharge: 2015-08-27 | Disposition: A | Discharge status: Home / Self Care | Payer: BLUE CROSS/BLUE SHIELD | Source: Ambulatory Visit | Attending: Orthopedic Surgery | Admitting: Orthopedic Surgery

## 2015-08-27 ENCOUNTER — Encounter (HOSPITAL_BASED_OUTPATIENT_CLINIC_OR_DEPARTMENT_OTHER)
Admission: RE | Disposition: A | Discharge status: Home / Self Care | Payer: Self-pay | Source: Ambulatory Visit | Attending: Orthopedic Surgery

## 2015-08-27 DIAGNOSIS — F17210 Nicotine dependence, cigarettes, uncomplicated: Secondary | ICD-10-CM | POA: Diagnosis not present

## 2015-08-27 DIAGNOSIS — W19XXXA Unspecified fall, initial encounter: Secondary | ICD-10-CM | POA: Insufficient documentation

## 2015-08-27 DIAGNOSIS — Y9289 Other specified places as the place of occurrence of the external cause: Secondary | ICD-10-CM | POA: Insufficient documentation

## 2015-08-27 DIAGNOSIS — Y998 Other external cause status: Secondary | ICD-10-CM | POA: Diagnosis not present

## 2015-08-27 DIAGNOSIS — S52571A Other intraarticular fracture of lower end of right radius, initial encounter for closed fracture: Secondary | ICD-10-CM | POA: Diagnosis present

## 2015-08-27 DIAGNOSIS — Y9389 Activity, other specified: Secondary | ICD-10-CM | POA: Insufficient documentation

## 2015-08-27 HISTORY — PX: OPEN REDUCTION INTERNAL FIXATION (ORIF) DISTAL RADIAL FRACTURE: SHX5989

## 2015-08-27 HISTORY — DX: Nicotine dependence, unspecified, uncomplicated: F17.200

## 2015-08-27 HISTORY — DX: Unspecified fracture of the lower end of right radius, initial encounter for closed fracture: S52.501A

## 2015-08-27 SURGERY — OPEN REDUCTION INTERNAL FIXATION (ORIF) DISTAL RADIUS FRACTURE
Anesthesia: Regional | Site: Wrist | Laterality: Right

## 2015-08-27 MED ORDER — ONDANSETRON HCL 4 MG/2ML IJ SOLN
INTRAMUSCULAR | Status: AC
  Filled 2015-08-27: qty 2

## 2015-08-27 MED ORDER — LACTATED RINGERS IV SOLN
INTRAVENOUS | Status: DC
  Administered 2015-08-27: 10 mL/h via INTRAVENOUS
  Administered 2015-08-27: 16:00:00 via INTRAVENOUS

## 2015-08-27 MED ORDER — HYDROMORPHONE HCL 1 MG/ML IJ SOLN: 0.2500 mg | INTRAMUSCULAR | Status: DC | PRN

## 2015-08-27 MED ORDER — SUCCINYLCHOLINE CHLORIDE 20 MG/ML IJ SOLN
INTRAMUSCULAR | Status: AC
  Filled 2015-08-27: qty 1

## 2015-08-27 MED ORDER — PROPOFOL 500 MG/50ML IV EMUL
INTRAVENOUS | Status: AC
  Filled 2015-08-27: qty 50

## 2015-08-27 MED ORDER — CEFAZOLIN SODIUM-DEXTROSE 2-3 GM-% IV SOLR
2.0000 g | INTRAVENOUS | Status: AC
  Administered 2015-08-27: 2 g via INTRAVENOUS

## 2015-08-27 MED ORDER — MIDAZOLAM HCL 2 MG/2ML IJ SOLN
INTRAMUSCULAR | Status: AC
  Filled 2015-08-27: qty 4

## 2015-08-27 MED ORDER — PROPOFOL 10 MG/ML IV BOLUS
INTRAVENOUS | Status: DC | PRN
  Administered 2015-08-27 (×3): 20 mg via INTRAVENOUS

## 2015-08-27 MED ORDER — FENTANYL CITRATE (PF) 100 MCG/2ML IJ SOLN
INTRAMUSCULAR | Status: AC
  Filled 2015-08-27: qty 2

## 2015-08-27 MED ORDER — DEXAMETHASONE SODIUM PHOSPHATE 10 MG/ML IJ SOLN
INTRAMUSCULAR | Status: AC
  Filled 2015-08-27: qty 1

## 2015-08-27 MED ORDER — LIDOCAINE HCL (CARDIAC) 20 MG/ML IV SOLN
INTRAVENOUS | Status: DC | PRN
  Administered 2015-08-27: 25 mg via INTRAVENOUS

## 2015-08-27 MED ORDER — FENTANYL CITRATE (PF) 100 MCG/2ML IJ SOLN
50.0000 ug | INTRAMUSCULAR | Status: DC | PRN
Start: 2015-08-27 — End: 2015-08-27
  Administered 2015-08-27: 100 ug via INTRAVENOUS

## 2015-08-27 MED ORDER — OXYCODONE HCL 5 MG/5ML PO SOLN: 5.0000 mg | Freq: Once | ORAL | Status: DC | PRN

## 2015-08-27 MED ORDER — CHLORHEXIDINE GLUCONATE 4 % EX LIQD: 60.0000 mL | Freq: Once | CUTANEOUS | Status: DC

## 2015-08-27 MED ORDER — MEPERIDINE HCL 25 MG/ML IJ SOLN: 6.2500 mg | INTRAMUSCULAR | Status: DC | PRN

## 2015-08-27 MED ORDER — OXYCODONE-ACETAMINOPHEN 5-325 MG PO TABS: ORAL_TABLET | ORAL | Status: DC

## 2015-08-27 MED ORDER — SCOPOLAMINE 1 MG/3DAYS TD PT72: 1.0000 | MEDICATED_PATCH | Freq: Once | TRANSDERMAL | Status: DC | PRN

## 2015-08-27 MED ORDER — MIDAZOLAM HCL 2 MG/2ML IJ SOLN
INTRAMUSCULAR | Status: AC
  Filled 2015-08-27: qty 2

## 2015-08-27 MED ORDER — CEFAZOLIN SODIUM-DEXTROSE 2-3 GM-% IV SOLR
INTRAVENOUS | Status: AC
  Filled 2015-08-27: qty 50

## 2015-08-27 MED ORDER — BUPIVACAINE HCL (PF) 0.25 % IJ SOLN
INTRAMUSCULAR | Status: AC
  Filled 2015-08-27: qty 30

## 2015-08-27 MED ORDER — DEXAMETHASONE SODIUM PHOSPHATE 4 MG/ML IJ SOLN
INTRAMUSCULAR | Status: DC | PRN
  Administered 2015-08-27: 4 mg via INTRAVENOUS

## 2015-08-27 MED ORDER — BUPIVACAINE-EPINEPHRINE (PF) 0.5% -1:200000 IJ SOLN
INTRAMUSCULAR | Status: DC | PRN
  Administered 2015-08-27: 30 mL via PERINEURAL

## 2015-08-27 MED ORDER — PROPOFOL 10 MG/ML IV BOLUS
INTRAVENOUS | Status: AC
  Filled 2015-08-27: qty 20

## 2015-08-27 MED ORDER — LIDOCAINE HCL (CARDIAC) 20 MG/ML IV SOLN
INTRAVENOUS | Status: AC
  Filled 2015-08-27: qty 5

## 2015-08-27 MED ORDER — FENTANYL CITRATE (PF) 100 MCG/2ML IJ SOLN
INTRAMUSCULAR | Status: AC
  Filled 2015-08-27: qty 4

## 2015-08-27 MED ORDER — PROPOFOL 500 MG/50ML IV EMUL
INTRAVENOUS | Status: DC | PRN
  Administered 2015-08-27: 160 ug/kg/min via INTRAVENOUS

## 2015-08-27 MED ORDER — ONDANSETRON HCL 4 MG/2ML IJ SOLN
INTRAMUSCULAR | Status: DC | PRN
  Administered 2015-08-27: 4 mg via INTRAVENOUS

## 2015-08-27 MED ORDER — GLYCOPYRROLATE 0.2 MG/ML IJ SOLN: 0.2000 mg | Freq: Once | INTRAMUSCULAR | Status: DC | PRN

## 2015-08-27 MED ORDER — MIDAZOLAM HCL 2 MG/2ML IJ SOLN
1.0000 mg | INTRAMUSCULAR | Status: DC | PRN
  Administered 2015-08-27: 1 mg via INTRAVENOUS
  Administered 2015-08-27: 2 mg via INTRAVENOUS
  Administered 2015-08-27: 1 mg via INTRAVENOUS

## 2015-08-27 MED ORDER — PROMETHAZINE HCL 25 MG/ML IJ SOLN: 6.2500 mg | INTRAMUSCULAR | Status: DC | PRN

## 2015-08-27 MED ORDER — OXYCODONE HCL 5 MG PO TABS: 5.0000 mg | ORAL_TABLET | Freq: Once | ORAL | Status: DC | PRN

## 2015-08-27 SURGICAL SUPPLY — 74 items
BANDAGE ELASTIC 3 VELCRO ST LF (GAUZE/BANDAGES/DRESSINGS) ×3 IMPLANT
BIT DRILL 2.0 LNG QUCK RELEASE (BIT) IMPLANT
BIT DRILL 2.8X5 QR DISP (BIT) ×2 IMPLANT
BLADE SURG 15 STRL LF DISP TIS (BLADE) ×2 IMPLANT
BLADE SURG 15 STRL SS (BLADE) ×6
BNDG CMPR 9X4 STRL LF SNTH (GAUZE/BANDAGES/DRESSINGS) ×2
BNDG ESMARK 4X9 LF (GAUZE/BANDAGES/DRESSINGS) ×5 IMPLANT
BNDG GAUZE ELAST 4 BULKY (GAUZE/BANDAGES/DRESSINGS) ×3 IMPLANT
BNDG PLASTER X FAST 3X3 WHT LF (CAST SUPPLIES) ×20 IMPLANT
BNDG PLSTR 9X3 FST ST WHT (CAST SUPPLIES) ×10
BONE CHIP PRESERV 5CC PCAN5 (Bone Implant) ×3 IMPLANT
CHLORAPREP W/TINT 26ML (MISCELLANEOUS) ×5 IMPLANT
CORDS BIPOLAR (ELECTRODE) ×5 IMPLANT
COVER BACK TABLE 60X90IN (DRAPES) ×3 IMPLANT
COVER MAYO STAND STRL (DRAPES) ×3 IMPLANT
DRAPE EXTREMITY T 121X128X90 (DRAPE) ×3 IMPLANT
DRAPE OEC MINIVIEW 54X84 (DRAPES) ×3 IMPLANT
DRAPE SURG 17X23 STRL (DRAPES) ×3 IMPLANT
DRILL 2.0 LNG QUICK RELEASE (BIT) ×3
GAUZE SPONGE 4X4 12PLY STRL (GAUZE/BANDAGES/DRESSINGS) ×3 IMPLANT
GAUZE XEROFORM 1X8 LF (GAUZE/BANDAGES/DRESSINGS) ×3 IMPLANT
GLOVE BIO SURGEON STRL SZ 6.5 (GLOVE) ×1 IMPLANT
GLOVE BIO SURGEON STRL SZ7.5 (GLOVE) ×3 IMPLANT
GLOVE BIO SURGEONS STRL SZ 6.5 (GLOVE) ×1
GLOVE BIOGEL PI IND STRL 7.0 (GLOVE) IMPLANT
GLOVE BIOGEL PI IND STRL 8 (GLOVE) ×1 IMPLANT
GLOVE BIOGEL PI IND STRL 8.5 (GLOVE) IMPLANT
GLOVE BIOGEL PI INDICATOR 7.0 (GLOVE) ×2
GLOVE BIOGEL PI INDICATOR 8 (GLOVE) ×2
GLOVE BIOGEL PI INDICATOR 8.5 (GLOVE)
GLOVE SURG ORTHO 8.0 STRL STRW (GLOVE) IMPLANT
GOWN STRL REUS W/ TWL LRG LVL3 (GOWN DISPOSABLE) ×1 IMPLANT
GOWN STRL REUS W/TWL LRG LVL3 (GOWN DISPOSABLE) ×3
GOWN STRL REUS W/TWL XL LVL3 (GOWN DISPOSABLE) ×5 IMPLANT
GRAFT BNE CANC CHIPS 1-8 5CC (Bone Implant) IMPLANT
GUIDEWIRE ORTHO 0.054X6 (WIRE) ×6 IMPLANT
NDL HYPO 25X1 1.5 SAFETY (NEEDLE) IMPLANT
NEEDLE HYPO 25X1 1.5 SAFETY (NEEDLE) IMPLANT
NS IRRIG 1000ML POUR BTL (IV SOLUTION) ×3 IMPLANT
PACK BASIN DAY SURGERY FS (CUSTOM PROCEDURE TRAY) ×5 IMPLANT
PAD CAST 3X4 CTTN HI CHSV (CAST SUPPLIES) ×1 IMPLANT
PADDING CAST ABS 4INX4YD NS (CAST SUPPLIES) ×2
PADDING CAST ABS COTTON 4X4 ST (CAST SUPPLIES) ×1 IMPLANT
PADDING CAST COTTON 3X4 STRL (CAST SUPPLIES) ×3
PLATE PROXIMAL VDU ACULOC (Plate) ×2 IMPLANT
SCREW CORT FT 18X2.3XLCK HEX (Screw) IMPLANT
SCREW CORTICAL LOCKING 2.3X16M (Screw) ×3 IMPLANT
SCREW CORTICAL LOCKING 2.3X18M (Screw) ×9 IMPLANT
SCREW CORTICAL LOCKING 2.3X20M (Screw) ×6 IMPLANT
SCREW FX16X2.3XLCK SMTH NS CRT (Screw) IMPLANT
SCREW FX18X2.3XSMTH LCK NS CRT (Screw) IMPLANT
SCREW FX20X2.3XSMTH LCK NS CRT (Screw) IMPLANT
SCREW HEX 3.5X15 NLCKG STRL (Screw) IMPLANT
SCREW HEX 3.5X15MM (Screw) ×3 IMPLANT
SCREW HEXALOBE NON-LOCK 3.5X14 (Screw) ×2 IMPLANT
SCREW NLCKG 13 3.5X13 HEXA (Screw) IMPLANT
SCREW NON-LOCK 3.5X13 (Screw) ×3 IMPLANT
SLEEVE SCD COMPRESS KNEE MED (MISCELLANEOUS) ×2 IMPLANT
STOCKINETTE 4X48 STRL (DRAPES) ×3 IMPLANT
SUCTION FRAZIER TIP 10 FR DISP (SUCTIONS) IMPLANT
SUT ETHILON 3 0 PS 1 (SUTURE) IMPLANT
SUT ETHILON 4 0 PS 2 18 (SUTURE) ×3 IMPLANT
SUT VIC AB 2-0 SH 27 (SUTURE)
SUT VIC AB 2-0 SH 27XBRD (SUTURE) IMPLANT
SUT VIC AB 3-0 PS1 18 (SUTURE)
SUT VIC AB 3-0 PS1 18XBRD (SUTURE) IMPLANT
SUT VICRYL 4-0 PS2 18IN ABS (SUTURE) ×3 IMPLANT
SYR BULB 3OZ (MISCELLANEOUS) ×5 IMPLANT
SYR CONTROL 10ML LL (SYRINGE) IMPLANT
TOWEL OR 17X24 6PK STRL BLUE (TOWEL DISPOSABLE) ×6 IMPLANT
TOWEL OR NON WOVEN STRL DISP B (DISPOSABLE) ×3 IMPLANT
TUBE CONNECTING 20'X1/4 (TUBING)
TUBE CONNECTING 20X1/4 (TUBING) IMPLANT
UNDERPAD 30X30 (UNDERPADS AND DIAPERS) ×3 IMPLANT

## 2015-08-27 NOTE — Op Note (Signed)
Intra-operative fluoroscopic images in the AP, lateral, and oblique views were taken and evaluated by myself.  Reduction and hardware placement were confirmed.  There was no intraarticular penetration of permanent hardware.  

## 2015-08-27 NOTE — Anesthesia Postprocedure Evaluation (Signed)
Anesthesia Post Note  Patient: Margaret Perez  Procedure(s) Performed: Procedure(s) (LRB): OPEN REDUCTION INTERNAL FIXATION (ORIF) RIGHT DISTAL RADIUS  FRACTURE (Right)  Anesthesia type: General  Patient location: PACU  Post pain: Pain level controlled  Post assessment: Post-op Vital signs reviewed  Last Vitals: BP 126/75 mmHg  Pulse 86  Temp(Src) 36.9 C (Oral)  Resp 18  Ht 5\' 8"  (1.727 m)  Wt 161 lb 3.2 oz (73.12 kg)  BMI 24.52 kg/m2  SpO2 99%  Post vital signs: Reviewed  Level of consciousness: sedated  Complications: No apparent anesthesia complications

## 2015-08-27 NOTE — Anesthesia Procedure Notes (Addendum)
Anesthesia Regional Block:  Axillary brachial plexus block  Pre-Anesthetic Checklist: ,, timeout performed, Correct Patient, Correct Site, Correct Laterality, Correct Procedure, Correct Position, site marked, Risks and benefits discussed, Surgical consent,  Pre-op evaluation,  Post-op pain management  Laterality: Right  Prep: chloraprep       Needles:  Injection technique: Single-shot  Needle Type: Stimulator Needle - 40     Needle Length: 4cm 4 cm Needle Gauge: 22 and 22 G    Additional Needles:  Procedures: ultrasound guided (picture in chart) Axillary brachial plexus block Narrative:  Injection made incrementally with aspirations every 5 mL.  Performed by: Personally  Anesthesiologist: Lewie LoronGERMEROTH, JOHN  Additional Notes: BP cuff, EKG monitors applied. Sedation begun. Nerve location verified with U/S. Anesthetic injected incrementally, slowly , and after neg aspirations under direct u/s guidance. Good perineural spread. Tolerated well.   Procedure Name: MAC Date/Time: 08/27/2015 4:00 PM Performed by: Curly ShoresRAFT, Kaoru Rezendes W Pre-anesthesia Checklist: Patient identified, Emergency Drugs available, Suction available and Patient being monitored Patient Re-evaluated:Patient Re-evaluated prior to inductionOxygen Delivery Method: Simple face mask Preoxygenation: Pre-oxygenation with 100% oxygen Intubation Type: IV induction

## 2015-08-27 NOTE — Discharge Instructions (Addendum)

## 2015-08-27 NOTE — Op Note (Signed)
[id number] 

## 2015-08-27 NOTE — Anesthesia Preprocedure Evaluation (Signed)
Anesthesia Evaluation  Patient identified by MRN, date of birth, ID band Patient awake    Reviewed: Allergy & Precautions, NPO status , Patient's Chart, lab work & pertinent test results  Airway Mallampati: II  TM Distance: >3 FB Neck ROM: Full    Dental no notable dental hx.    Pulmonary Current Smoker,    Pulmonary exam normal breath sounds clear to auscultation       Cardiovascular negative cardio ROS Normal cardiovascular exam Rhythm:Regular Rate:Normal     Neuro/Psych PSYCHIATRIC DISORDERS negative neurological ROS  negative psych ROS   GI/Hepatic negative GI ROS, Neg liver ROS,   Endo/Other  negative endocrine ROS  Renal/GU negative Renal ROS     Musculoskeletal negative musculoskeletal ROS (+)   Abdominal   Peds  Hematology negative hematology ROS (+)   Anesthesia Other Findings   Reproductive/Obstetrics negative OB ROS                             Anesthesia Physical Anesthesia Plan  ASA: II  Anesthesia Plan: Regional   Post-op Pain Management:    Induction: Intravenous  Airway Management Planned:   Additional Equipment:   Intra-op Plan:   Post-operative Plan:   Informed Consent: I have reviewed the patients History and Physical, chart, labs and discussed the procedure including the risks, benefits and alternatives for the proposed anesthesia with the patient or authorized representative who has indicated his/her understanding and acceptance.   Dental advisory given  Plan Discussed with: CRNA  Anesthesia Plan Comments:         Anesthesia Quick Evaluation

## 2015-08-27 NOTE — Transfer of Care (Signed)
Immediate Anesthesia Transfer of Care Note  Patient: Margaret Perez  Procedure(s) Performed: Procedure(s): OPEN REDUCTION INTERNAL FIXATION (ORIF) RIGHT DISTAL RADIUS  FRACTURE (Right)  Patient Location: PACU  Anesthesia Type:MAC combined with regional for post-op pain  Level of Consciousness: awake, alert  and oriented  Airway & Oxygen Therapy: Patient Spontanous Breathing and Patient connected to face mask oxygen  Post-op Assessment: Report given to RN, Post -op Vital signs reviewed and stable and Patient moving all extremities  Post vital signs: Reviewed and stable  Last Vitals:  Filed Vitals:   08/27/15 1515  BP:   Pulse: 87  Temp:   Resp: 13    Complications: No apparent anesthesia complications

## 2015-08-27 NOTE — Brief Op Note (Signed)
08/27/2015  5:04 PM  PATIENT:  Margaret Perez  44 y.o. female  PRE-OPERATIVE DIAGNOSIS:  RIGHT DISTAL RADIUS FRACTURE   POST-OPERATIVE DIAGNOSIS:  RIGHT DISTAL RADIUS FRACTURE  PROCEDURE:  Procedure(s): OPEN REDUCTION INTERNAL FIXATION (ORIF) RIGHT DISTAL RADIUS  FRACTURE (Right)  SURGEON:  Surgeon(s) and Role:    * Betha LoaKevin Lasondra Hodgkins, MD - Primary  PHYSICIAN ASSISTANT:   ASSISTANTS: Cindee SaltGary Kahliya Fraleigh, MD   ANESTHESIA:   regional and IV sedation  EBL:  Total I/O In: 1700 [I.V.:1700] Out: -   BLOOD ADMINISTERED:none  DRAINS: none   LOCAL MEDICATIONS USED:  NONE  SPECIMEN:  No Specimen  DISPOSITION OF SPECIMEN:  N/A  COUNTS:  YES  TOURNIQUET:   Total Tourniquet Time Documented: Upper Arm (Right) - 54 minutes Total: Upper Arm (Right) - 54 minutes   DICTATION: .Other Dictation: Dictation Number [id number]  PLAN OF CARE: Discharge to home after PACU  PATIENT DISPOSITION:  PACU - hemodynamically stable.

## 2015-08-27 NOTE — Progress Notes (Signed)
Assisted Dr. Germeroth with right, ultrasound guided, axillary block. Side rails up, monitors on throughout procedure. See vital signs in flow sheet. Tolerated Procedure well. 

## 2015-08-27 NOTE — H&P (Signed)
  Margaret Perez is an 44 y.o. female.   Chief Complaint: right distal radius fracture HPI: 44 yo rhd female states she fell 08/21/15 injuring right wrist.  Seen in ED for closed reduction and splinting.  Followed up in office for surgical planning.  Reports no previous injury to wrist and no other injury at this time.  Past Medical History  Diagnosis Date  . Distal radius fracture, right   . Smoker     Past Surgical History  Procedure Laterality Date  . Bronchoscopy    . Wrist fracture surgery      at age 44    History reviewed. No pertinent family history. Social History:  reports that she has been smoking Cigarettes.  She has been smoking about 1.00 pack per day. She does not have any smokeless tobacco history on file. She reports that she drinks about 12.6 oz of alcohol per week. She reports that she does not use illicit drugs.  Allergies: No Known Allergies  Medications Prior to Admission  Medication Sig Dispense Refill  . HYDROcodone-acetaminophen (NORCO/VICODIN) 5-325 MG tablet Take 1 tablet by mouth every 4 (four) hours as needed for severe pain. 18 tablet 0  . medroxyPROGESTERone (DEPO-PROVERA) 150 MG/ML injection Inject 150 mg into the muscle every 3 (three) months. every 11-12 weeks  3    No results found for this or any previous visit (from the past 48 hour(s)).  No results found.   Review of systems not obtained due to patient factors.  Blood pressure 111/75, pulse 92, temperature 99.2 F (37.3 C), temperature source Oral, resp. rate 16, height 5\' 8"  (1.727 m), weight 73.12 kg (161 lb 3.2 oz), SpO2 99 %.  General appearance: alert, cooperative and appears stated age Head: Normocephalic, without obvious abnormality, atraumatic Neck: supple, symmetrical, trachea midline Resp: clear to auscultation bilaterally Cardio: regular rate and rhythm GI: non tender Extremities: intact sensation and capillary refill all digits.  +epl/fpl/io. Pulses: 2+ and  symmetric Skin: Skin color, texture, turgor normal. No rashes or lesions Neurologic: Grossly normal Incision/Wound: none  Assessment/Plan Right distal radius fracture.  Non operative and operative treatment options were discussed with the patient and patient wishes to proceed with operative treatment. Risks, benefits, and alternatives of surgery were discussed and the patient agrees with the plan of care.   Margaret Perez R 08/27/2015, 2:52 PM

## 2015-08-28 NOTE — Op Note (Deleted)
Margaret Perez, Margaret Perez                ACCOUNT NO.:  [id number]  MEDICAL RECORD NO.:  [id number]  LOCATION:                                FACILITY:  [facility]  PHYSICIAN:  Betha LoaKevin Zannah Melucci, MD        DATE OF BIRTH:  Feb 06, 1971  DATE OF PROCEDURE:  08/27/2015 DATE OF DISCHARGE:  08/27/2015                              OPERATIVE REPORT   PREOPERATIVE DIAGNOSIS:  Right comminuted intra-articular distal radius fracture.  POSTOPERATIVE DIAGNOSIS:  Right comminuted intra-articular distal radius fracture.  PROCEDURE:  Open reduction and internal fixation, right comminuted intra- articular distal radius fracture, and right brachioradialis release.  SURGEON:  Betha LoaKevin Cina Klumpp, MD  ASSISTANT:  Cindee SaltGary Prisha Hiley, MD  ANESTHESIA:  General with regional with sedation.  IV FLUIDS:  Per Anesthesia flow sheet.  ESTIMATED BLOOD LOSS:  Minimal.  COMPLICATIONS:  None.  SPECIMENS:  None.  TOURNIQUET TIME:  54 minutes.  DISPOSITION:  Stable to PACU.  INDICATIONS:  Margaret Perez is a 44 year old right-hand-dominant female, who approximately 1 week ago fell injuring her right wrist.  [facility] where radiographs were taken revealing distal wrist fracture with displacement.  This was counseled with management of injury.  Closed reduction was performed in the emergency department.  She returned to the office for surgical planning. Risks, benefits, and alternatives of surgery were discussed including risk of blood loss; infection; damage to nerves, vessels, tendons, ligaments, bone; failure of surgery; need for additional surgery; complications with wound healing; continued pain; nonunion; malunion; stiffness.  She voiced understanding of these risks, elected to proceed.  OPERATIVE COURSE:  After being identified preoperatively by myself, the patient and I agreed upon the procedure and site of procedure.  Surgical site was marked.  Risks, benefits, and alternatives of surgery  were reviewed and she wished to proceed.  Surgical consent had been signed. A regional block was performed by Anesthesia in preoperative holding. She was given IV Ancef as preoperative antibiotic prophylaxis.  She was transferred to the operating room and placed on the operating room table in supine position with the right upper extremity on arm board.  IV sedation was induced.  Right upper extremity was prepped and draped in normal sterile orthopedic fashion.  Surgical pause was performed between surgeons, anesthesia, operating room staff, and all were in agreement as to the patient, procedure, and site of procedure.  Tourniquet at the proximal aspect of the extremity was inflated to 250 mmHg after exsanguination with an Esmarch bandage.  Standard volar Sherilyn CooterHenry approach was used.  Superficial and deep portions of the FCR sheath were incised. The FCR and the FPL swept ulnarly to protect the palmar cutaneous branch of the median nerve.  The brachioradialis was released from the radial side of the radius.  The pronator quadratus was released and elevated. The fracture site was easily identified.  It was cleared of soft tissue interposition and was able to be reduced under direct visualization.  C- arm images in AP and lateral projections showed good reduction.  There was a bone impaction dorsally.  It was felt the bone graft was appropriate.  Cancellous bone chips were used to  graft the fracture site.  A volar plate from the [employer] distal radial locking set was selected and secured to the bone with the guide pins.  It was adjusted until appropriate fit was obtained.  C-arm was used in AP, lateral, and oblique projections to ensure appropriate reduction and position of hardware, which was the case.  Standard AO drilling measuring technique was used.  A single screw was placed in a slotted hole in the shaft of the plate.  The distal holes were filled with locking pegs with the exception of  styloid holes which were filled with locking screws. Remaining 2 holes in the shaft of the plate were filled with nonlocking screws.  Good purchase was obtained.  The C-arm was used in AP, lateral, and oblique projections to ensure appropriate reduction and position of hardware, which was the case.  The wrist was placed through range of motion and good pronation and supination was obtained.  Distal radioulnar joint was stable.  The wound was copiously irrigated with sterile saline.  Pronator quadratus was repaired back over top of the plate with 4-0 Vicryl suture.  Inverted interrupted Vicryl sutures were placed in subcutaneous tissues and skin was closed with 4-0 nylon in a horizontal mattress fashion.  The wound was dressed with sterile Xeroform, 4x4s, and wrapped with a Kerlix bandage.  The volar splint was placed and wrapped with Kerlix and Ace bandage.  Tourniquet was deflated at 54 minutes.  Fingertips were pink with brisk capillary refill after deflation of the tourniquet.  Operative drapes were broken down.  The patient was awoken from anesthesia safely.  She was transferred back to stretcher and taken to PACU in stable condition.  I will see her back in the office in 1 week for postoperative followup.  We will give her Percocet 5/325, 1-2 p.o. q.6 hours p.r.n. pain, dispensed #40.     Betha Loa, MD   ______________________________ Betha Loa, MD    KK/MEDQ  D:  08/27/2015  T:  08/28/2015  Job:  [id number]

## 2015-08-28 NOTE — Op Note (Signed)
Margaret Perez:  Siever, Litzi                ACCOUNT NO.:  [id number]  MEDICAL RECORD NO.:  [id number]  LOCATION:                                FACILITY:  [facility]  PHYSICIAN:  Betha LoaKevin Quitman Norberto, MD        DATE OF BIRTH:  15-Feb-1971  DATE OF PROCEDURE:  08/27/2015 DATE OF DISCHARGE:  08/27/2015                              OPERATIVE REPORT   PREOPERATIVE DIAGNOSIS:  Right comminuted intra-articular distal radius fracture.  POSTOPERATIVE DIAGNOSIS:  Right comminuted intra-articular distal radius fracture.  PROCEDURE:   1. Open reduction and internal fixation, right comminuted intra- articular distal radius fracture 2. Right brachioradialis release.  SURGEON:  Betha LoaKevin Jerral Mccauley, MD  ASSISTANT:  Cindee SaltGary Tishawna Larouche, MD  ANESTHESIA:  General with regional with sedation.  IV FLUIDS:  Per Anesthesia flow sheet.  ESTIMATED BLOOD LOSS:  Minimal.  COMPLICATIONS:  None.  SPECIMENS:  None.  TOURNIQUET TIME:  54 minutes.  DISPOSITION:  Stable to PACU.  INDICATIONS:  Ms. Mayford KnifeWilliams is a 44 year old right-hand-dominant female, who approximately 1 week ago fell injuring her right wrist.  [facility] where radiographs were taken revealing distal wrist fracture with displacement.  This was counseled with management of injury.  Closed reduction was performed in the emergency department.  She returned to the office for surgical planning. Risks, benefits, and alternatives of surgery were discussed including risk of blood loss; infection; damage to nerves, vessels, tendons, ligaments, bone; failure of surgery; need for additional surgery; complications with wound healing; continued pain; nonunion; malunion; stiffness.  She voiced understanding of these risks, elected to proceed.  OPERATIVE COURSE:  After being identified preoperatively by myself, the patient and I agreed upon the procedure and site of procedure.  Surgical site was marked.  Risks, benefits, and alternatives of surgery  were reviewed and she wished to proceed.  Surgical consent had been signed. A regional block was performed by Anesthesia in preoperative holding. She was given IV Ancef as preoperative antibiotic prophylaxis.  She was transferred to the operating room and placed on the operating room table in supine position with the right upper extremity on arm board.  IV sedation was induced.  Right upper extremity was prepped and draped in normal sterile orthopedic fashion.  Surgical pause was performed between surgeons, anesthesia, operating room staff, and all were in agreement as to the patient, procedure, and site of procedure.  Tourniquet at the proximal aspect of the extremity was inflated to 250 mmHg after exsanguination with an Esmarch bandage.  Standard volar Sherilyn CooterHenry approach was used.  Superficial and deep portions of the FCR sheath were incised. The FCR and the FPL swept ulnarly to protect the palmar cutaneous branch of the median nerve.  The brachioradialis was released from the radial side of the radius.  The pronator quadratus was released and elevated. The fracture site was easily identified.  It was cleared of soft tissue interposition and was able to be reduced under direct visualization.  C- arm images in AP and lateral projections showed good reduction.  There was a bone impaction dorsally.  It was felt the bone graft was appropriate.  Cancellous bone chips were  used to graft the fracture site.  A volar plate from the [employer] distal radial locking set was selected and secured to the bone with the guide pins.  It was adjusted until appropriate fit was obtained.  C-arm was used in AP, lateral, and oblique projections to ensure appropriate reduction and position of hardware, which was the case.  Standard AO drilling measuring technique was used.  A single screw was placed in a slotted hole in the shaft of the plate.  The distal holes were filled with locking pegs with the exception of  styloid holes which were filled with locking screws. Remaining 2 holes in the shaft of the plate were filled with nonlocking screws.  Good purchase was obtained.  The C-arm was used in AP, lateral, and oblique projections to ensure appropriate reduction and position of hardware, which was the case.  The wrist was placed through range of motion and good pronation and supination was obtained.  Distal radioulnar joint was stable.  The wound was copiously irrigated with sterile saline.  Pronator quadratus was repaired back over top of the plate with 4-0 Vicryl suture.  Inverted interrupted Vicryl sutures were placed in subcutaneous tissues and skin was closed with 4-0 nylon in a horizontal mattress fashion.  The wound was dressed with sterile Xeroform, 4x4s, and wrapped with a Kerlix bandage.  The volar splint was placed and wrapped with Kerlix and Ace bandage.  Tourniquet was deflated at 54 minutes.  Fingertips were pink with brisk capillary refill after deflation of the tourniquet.  Operative drapes were broken down.  The patient was awoken from anesthesia safely.  She was transferred back to stretcher and taken to PACU in stable condition.  I will see her back in the office in 1 week for postoperative followup.  We will give her Percocet 5/325, 1-2 p.o. q.6 hours p.r.n. pain, dispensed #40.     Betha Loa, MD   ______________________________ Betha Loa, MD    KK/MEDQ  D:  08/27/2015  T:  08/28/2015  Job:  [id number]

## 2015-08-30 ENCOUNTER — Encounter (HOSPITAL_BASED_OUTPATIENT_CLINIC_OR_DEPARTMENT_OTHER): Payer: Self-pay | Admitting: Orthopedic Surgery

## 2015-12-29 ENCOUNTER — Encounter (HOSPITAL_COMMUNITY): Payer: Self-pay | Admitting: *Deleted

## 2016-01-04 ENCOUNTER — Other Ambulatory Visit: Payer: Self-pay | Admitting: Obstetrics & Gynecology

## 2016-01-05 ENCOUNTER — Encounter: Payer: Self-pay | Admitting: Obstetrics & Gynecology

## 2016-01-07 ENCOUNTER — Encounter (HOSPITAL_COMMUNITY): Payer: Self-pay | Admitting: *Deleted

## 2016-01-07 ENCOUNTER — Ambulatory Visit (HOSPITAL_COMMUNITY): Payer: BLUE CROSS/BLUE SHIELD | Admitting: Anesthesiology

## 2016-01-07 ENCOUNTER — Ambulatory Visit (HOSPITAL_COMMUNITY)
Admission: RE | Admit: 2016-01-07 | Discharge: 2016-01-07 | Disposition: A | Discharge status: Home / Self Care | Payer: BLUE CROSS/BLUE SHIELD | Source: Ambulatory Visit | Attending: Obstetrics & Gynecology | Admitting: Obstetrics & Gynecology

## 2016-01-07 ENCOUNTER — Encounter (HOSPITAL_COMMUNITY)
Admission: RE | Disposition: A | Discharge status: Home / Self Care | Payer: Self-pay | Source: Ambulatory Visit | Attending: Obstetrics & Gynecology

## 2016-01-07 DIAGNOSIS — D071 Carcinoma in situ of vulva: Secondary | ICD-10-CM | POA: Diagnosis not present

## 2016-01-07 DIAGNOSIS — F17210 Nicotine dependence, cigarettes, uncomplicated: Secondary | ICD-10-CM | POA: Insufficient documentation

## 2016-01-07 DIAGNOSIS — A630 Anogenital (venereal) warts: Secondary | ICD-10-CM | POA: Insufficient documentation

## 2016-01-07 DIAGNOSIS — L821 Other seborrheic keratosis: Secondary | ICD-10-CM | POA: Insufficient documentation

## 2016-01-07 HISTORY — PX: CO2 LASER APPLICATION: SHX5778

## 2016-01-07 HISTORY — PX: LESION REMOVAL: SHX5196

## 2016-01-07 LAB — CBC
HCT: 44.2 % (ref 36.0–46.0)
HEMOGLOBIN: 15.3 g/dL — AB (ref 12.0–15.0)
MCH: 31.3 pg (ref 26.0–34.0)
MCHC: 34.6 g/dL (ref 30.0–36.0)
MCV: 90.4 fL (ref 78.0–100.0)
Platelets: 328 10*3/uL (ref 150–400)
RBC: 4.89 MIL/uL (ref 3.87–5.11)
RDW: 14 % (ref 11.5–15.5)
WBC: 11.1 10*3/uL — AB (ref 4.0–10.5)

## 2016-01-07 LAB — PREGNANCY, URINE: PREG TEST UR: NEGATIVE

## 2016-01-07 SURGERY — CO2 LASER APPLICATION
Anesthesia: General | Site: Vagina

## 2016-01-07 MED ORDER — DEXAMETHASONE SODIUM PHOSPHATE 10 MG/ML IJ SOLN
INTRAMUSCULAR | Status: DC | PRN
  Administered 2016-01-07: 4 mg via INTRAVENOUS

## 2016-01-07 MED ORDER — LIDOCAINE 5 % EX OINT
TOPICAL_OINTMENT | CUTANEOUS | Status: AC
  Filled 2016-01-07: qty 35.44

## 2016-01-07 MED ORDER — SILVER SULFADIAZINE 1 % EX CREA
TOPICAL_CREAM | CUTANEOUS | Status: AC
  Filled 2016-01-07: qty 85

## 2016-01-07 MED ORDER — LIDOCAINE HCL 1 % IJ SOLN
INTRAMUSCULAR | Status: AC
  Filled 2016-01-07: qty 20

## 2016-01-07 MED ORDER — ONDANSETRON HCL 4 MG/2ML IJ SOLN
INTRAMUSCULAR | Status: DC | PRN
  Administered 2016-01-07: 4 mg via INTRAVENOUS

## 2016-01-07 MED ORDER — PROPOFOL 10 MG/ML IV BOLUS
INTRAVENOUS | Status: DC | PRN
  Administered 2016-01-07: 70 mg via INTRAVENOUS
  Administered 2016-01-07: 20 mg via INTRAVENOUS
  Administered 2016-01-07: 180 mg via INTRAVENOUS

## 2016-01-07 MED ORDER — LIDOCAINE HCL (CARDIAC) 20 MG/ML IV SOLN
INTRAVENOUS | Status: DC | PRN
  Administered 2016-01-07: 30 mg via INTRAVENOUS

## 2016-01-07 MED ORDER — CEFAZOLIN SODIUM-DEXTROSE 2-3 GM-% IV SOLR
2.0000 g | INTRAVENOUS | Status: AC
  Administered 2016-01-07: 2 g via INTRAVENOUS

## 2016-01-07 MED ORDER — LIDOCAINE HCL (CARDIAC) 20 MG/ML IV SOLN
INTRAVENOUS | Status: AC
  Filled 2016-01-07: qty 5

## 2016-01-07 MED ORDER — KETOROLAC TROMETHAMINE 30 MG/ML IJ SOLN
INTRAMUSCULAR | Status: AC
  Filled 2016-01-07: qty 1

## 2016-01-07 MED ORDER — FENTANYL CITRATE (PF) 250 MCG/5ML IJ SOLN
INTRAMUSCULAR | Status: AC
  Filled 2016-01-07: qty 5

## 2016-01-07 MED ORDER — FENTANYL CITRATE (PF) 100 MCG/2ML IJ SOLN: 25.0000 ug | INTRAMUSCULAR | Status: DC | PRN

## 2016-01-07 MED ORDER — PROPOFOL 10 MG/ML IV BOLUS
INTRAVENOUS | Status: AC
  Filled 2016-01-07: qty 20

## 2016-01-07 MED ORDER — LACTATED RINGERS IV SOLN
INTRAVENOUS | Status: DC
  Administered 2016-01-07 (×2): via INTRAVENOUS

## 2016-01-07 MED ORDER — DEXAMETHASONE SODIUM PHOSPHATE 4 MG/ML IJ SOLN
INTRAMUSCULAR | Status: AC
  Filled 2016-01-07: qty 1

## 2016-01-07 MED ORDER — ACETIC ACID 5 % SOLN
Status: AC
Start: 2016-01-07 — End: 2016-01-07
  Filled 2016-01-07: qty 500

## 2016-01-07 MED ORDER — FENTANYL CITRATE (PF) 100 MCG/2ML IJ SOLN
INTRAMUSCULAR | Status: DC | PRN
  Administered 2016-01-07 (×2): 50 ug via INTRAVENOUS
  Administered 2016-01-07 (×2): 25 ug via INTRAVENOUS

## 2016-01-07 MED ORDER — CEFAZOLIN SODIUM-DEXTROSE 2-3 GM-% IV SOLR
INTRAVENOUS | Status: AC
  Filled 2016-01-07: qty 50

## 2016-01-07 MED ORDER — EPHEDRINE 5 MG/ML INJ
INTRAVENOUS | Status: AC
  Filled 2016-01-07: qty 10

## 2016-01-07 MED ORDER — FERRIC SUBSULFATE 259 MG/GM EX SOLN
CUTANEOUS | Status: AC
  Filled 2016-01-07: qty 8

## 2016-01-07 MED ORDER — MIDAZOLAM HCL 2 MG/2ML IJ SOLN
INTRAMUSCULAR | Status: DC | PRN
  Administered 2016-01-07: 1 mg via INTRAVENOUS

## 2016-01-07 MED ORDER — KETOROLAC TROMETHAMINE 30 MG/ML IJ SOLN
INTRAMUSCULAR | Status: DC | PRN
  Administered 2016-01-07: 30 mg via INTRAVENOUS

## 2016-01-07 MED ORDER — IODINE STRONG (LUGOLS) 5 % PO SOLN
ORAL | Status: AC
  Filled 2016-01-07: qty 1

## 2016-01-07 MED ORDER — LIDOCAINE HCL 1 % IJ SOLN
INTRAMUSCULAR | Status: DC | PRN
  Administered 2016-01-07: 10 mL

## 2016-01-07 MED ORDER — ONDANSETRON HCL 4 MG/2ML IJ SOLN
INTRAMUSCULAR | Status: AC
  Filled 2016-01-07: qty 2

## 2016-01-07 MED ORDER — ACETIC ACID 5 % SOLN
Status: DC | PRN
  Administered 2016-01-07: 1 via TOPICAL

## 2016-01-07 MED ORDER — MIDAZOLAM HCL 2 MG/2ML IJ SOLN
INTRAMUSCULAR | Status: AC
  Filled 2016-01-07: qty 2

## 2016-01-07 MED ORDER — EPHEDRINE SULFATE 50 MG/ML IJ SOLN
INTRAMUSCULAR | Status: DC | PRN
  Administered 2016-01-07: 15 mg via INTRAVENOUS

## 2016-01-07 MED ORDER — PROMETHAZINE HCL 25 MG/ML IJ SOLN: 6.2500 mg | INTRAMUSCULAR | Status: DC | PRN

## 2016-01-07 MED ORDER — ACETIC ACID 5 % SOLN
Status: AC
  Filled 2016-01-07: qty 500

## 2016-01-07 MED ORDER — SCOPOLAMINE 1 MG/3DAYS TD PT72: 1.0000 | MEDICATED_PATCH | Freq: Once | TRANSDERMAL | Status: DC

## 2016-01-07 MED ORDER — SODIUM CHLORIDE 0.9 % IJ SOLN
INTRAMUSCULAR | Status: AC
  Filled 2016-01-07: qty 10

## 2016-01-07 SURGICAL SUPPLY — 6 items
ATCH SMKEVC FLXB CAUT HNDSWH (FILTER) IMPLANT
CATH ROBINSON RED A/P 16FR (CATHETERS) ×2 IMPLANT
EVACUATOR SMOKE ACCUVAC VALLEY (FILTER) ×2
PACK VAGINAL MINOR WOMEN LF (CUSTOM PROCEDURE TRAY) ×2 IMPLANT
SCOPETTES 8  STERILE (MISCELLANEOUS) ×2
SCOPETTES 8 STERILE (MISCELLANEOUS) IMPLANT

## 2016-01-07 NOTE — Transfer of Care (Signed)
Immediate Anesthesia Transfer of Care Note  Patient: Margaret Perez  Procedure(s) Performed: Procedure(s): CO2 LASER APPLICATION of Vulva (N/A) EXCISION VAGINAL/Vulvar LESION (N/A)  Patient Location: PACU  Anesthesia Type:General  Level of Consciousness: awake, alert , oriented and patient cooperative  Airway & Oxygen Therapy: Patient Spontanous Breathing and Patient connected to nasal cannula oxygen  Post-op Assessment: Report given to RN and Post -op Vital signs reviewed and stable  Post vital signs: Reviewed and stable  Last Vitals:  Filed Vitals:   01/07/16 0741  BP: 115/85  Pulse: 92  Temp: 36.9 C  Resp: 20    Complications: No apparent anesthesia complications

## 2016-01-07 NOTE — Discharge Summary (Signed)
  Physician Discharge Summary  Patient ID: Margaret Perez MRN: [id number] DOB/AGE: 46-Jun-1972 46 y.o.  Admit date: 01/07/2016 Discharge date: 01/07/2016  Admission Diagnoses: Recurrent VIN 3, Skin Tags  Discharge Diagnoses: Recurrent VIN 3, Skin Tags        Active Problems:   * No active hospital problems. *   Discharged Condition: good  Hospital Course: Outpatient  Consults: None  Treatments: surgery: Laser fulguration of VIN and Excision of VIN3 and skin tag  Disposition: 01-Home or Self Care     Medication List    TAKE these medications        cholecalciferol 1000 units tablet  Commonly known as:  VITAMIN D  Take 1,000 Units by mouth daily.     Fish Oil 1200 MG Caps  Take 2 capsules by mouth daily.     fluticasone 50 MCG/ACT nasal spray  Commonly known as:  FLONASE  Place 1 spray into both nostrils daily as needed for allergies or rhinitis.     folic acid 800 MCG tablet  Commonly known as:  FOLVITE  Take 3,200-4,000 mcg by mouth daily. Take 4 to 5 tablets daily.     medroxyPROGESTERone 150 MG/ML injection  Commonly known as:  DEPO-PROVERA  Inject 150 mg into the muscle every 3 (three) months. every 11-12 weeks     MULTIVITAMIN PO  Take 1 tablet by mouth daily.     vitamin B-12 1000 MCG tablet  Commonly known as:  CYANOCOBALAMIN  Take 1,000 mcg by mouth daily.     VITAMIN C PO  Take 1 tablet by mouth as needed (cold symptoms).      ASK your doctor about these medications        oxyCODONE-acetaminophen 5-325 MG tablet  Commonly known as:  PERCOCET  1-2 tabs po q6 hours prn pain         Signed: Genia DelLAVOIE,MARIE-LYNE, MD 01/07/2016, 10:53 AM

## 2016-01-07 NOTE — Op Note (Signed)
01/07/2016  10:37 AM  PATIENT:  Margaret Perez  45 y.o. female  PRE-OPERATIVE DIAGNOSIS:  Recurrent VIN 3, Skin Tag  POST-OPERATIVE DIAGNOSIS:  Recurrent VIN 3, Skin Tag  PROCEDURE:  Procedure(s): CO2 Laser fulguration of Vulvar Lesion at right anterior labia minora Excision of Vulvar Intra-epithelia Neoplasia (VIN 3) at right post vulva/perineum Excision of left vulvar pigmented skin tag  SURGEON:  Surgeon(s): Genia DelMarie-Lyne Lezette Kitts, MD  ASSISTANTS: none   ANESTHESIA:   general   PROCEDURE:  Under general anesthesia with laryngeal mask the patient is an lithotomy position.  We apply acetic acid on the vulva.  We proceed with CO2 laser fulguration at a power of 10 on the anterior right labia minora under colposcopic guidance.  This area is aceto-white and corresponds to a probable area of mild VIN.  The decision was made to use laser in that area because the VIN appears mild and it is very close to the clitoris.  The other lesion is at the posterior right vulva and perineum and was confirmed to be VIN 3 by biopsy at the time of office colposcopy.  We therefore decided to excise this lesion with a good margin.  We prepped with Betadine at that area.  A scalpel was used to completely resect that lesion with a good margin of normal tissue.  The lesion was about 2 cm in diameter.  The specimen was sent to pathology.  The defect was closed with a Vicryl 2-0 in figure-of-eights.  The skin was closed with a Monocryl 3-0 in a subcuticular stitch.  Hemostasis was adequate.  We also excised a pigmented skin tag at the left mid labia majora.  The base was very small and not bleeding so silver nitrate was used to assure hemostasis.  The bladder was catheterized.  The patient was brought to recovery in good and stable status.  ESTIMATED BLOOD LOSS: 10 cc   Intake/Output Summary (Last 24 hours) at 01/07/16 1037 Last data filed at 01/07/16 1019  Gross per 24 hour  Intake   1500 ml  Output     60 ml  Net    1440 ml     BLOOD ADMINISTERED:none   LOCAL MEDICATIONS USED:  LIDOCAINE 1% 10 cc  SPECIMEN:  Source of Specimen:  Right posterior vulva/perineum (VIN 3 on Colpo Bx), Left labia majora pigmented skin tag.  DISPOSITION OF SPECIMEN:  PATHOLOGY  COUNTS:  YES  PLAN OF CARE: Transfer to PACU  Genia DelMarie-Lyne Harlow Carrizales MD  01/07/2016 at 10:38 am

## 2016-01-07 NOTE — H&P (Addendum)
Margaret Perez is an 45 y.o. 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 Patient has had an injection.    Past Medical History  Diagnosis Date  . Distal radius fracture, right     surgery 08/2015  . Smoker     Past Surgical History  Procedure Laterality Date  . Bronchoscopy    . Wrist fracture surgery Right     at age 45  . Open reduction internal fixation (orif) distal radial fracture Right 08/27/2015    Procedure: OPEN REDUCTION INTERNAL FIXATION (ORIF) RIGHT DISTAL RADIUS  FRACTURE;  Surgeon: Betha LoaKevin Kuzma, MD;  Location: [facility];  Service: Orthopedics;  Laterality: Right;  . Wisdom tooth extraction      History reviewed. No pertinent family history.  Social History:  reports that she has been smoking Cigarettes.  She has a 25 pack-year smoking history. She has never used smokeless tobacco. She reports that she drinks about 12.6 oz of alcohol per week. She reports that she does not use illicit drugs.  Allergies: No Known Allergies  Prescriptions prior to admission  Medication Sig Dispense Refill Last Dose  . Ascorbic Acid (VITAMIN C PO) Take 1 tablet by mouth as needed (cold symptoms).     . cholecalciferol (VITAMIN D) 1000 units tablet Take 1,000 Units by mouth daily.     . fluticasone (FLONASE) 50 MCG/ACT nasal spray Place 1 spray into both nostrils daily as needed for allergies or rhinitis.     . folic acid (FOLVITE) 800 MCG tablet Take 3,200-4,000 mcg by mouth daily. Take 4 to 5 tablets daily.     . Multiple Vitamins-Minerals (MULTIVITAMIN PO) Take 1 tablet by mouth daily.     . Omega-3 Fatty Acids (FISH OIL) 1200 MG CAPS Take 2 capsules by mouth daily.      . vitamin B-12 (CYANOCOBALAMIN) 1000 MCG tablet Take 1,000 mcg by mouth daily.     . medroxyPROGESTERone (DEPO-PROVERA) 150 MG/ML injection Inject 150 mg into the muscle every 3 (three) months. every 11-12 weeks  3 Unknown at Unknown time  . oxyCODONE-acetaminophen (PERCOCET) 5-325 MG tablet 1-2 tabs po q6 hours prn pain (Patient not taking: Reported on 12/30/2015) 40 tablet 0     ROS  Neg  Blood pressure 115/85, pulse 92, temperature 98.5 F (36.9 C), temperature source Oral, resp. rate 20, height 5\' 8"  (1.727 m), weight 165 lb (74.844 kg), SpO2 98 %. Physical Exam  Results for orders placed or performed during the hospital encounter of 01/07/16 (from the past 24 hour(s))  Pregnancy, urine     Status: None   Collection Time: 01/07/16  7:30 AM  Result Value Ref Range   Preg Test, Ur NEGATIVE NEGATIVE  CBC     Status: Abnormal   Collection Time: 01/07/16  7:54 AM  Result Value Ref Range   WBC 11.1 (H) 4.0 - 10.5 K/uL   RBC 4.89 3.87 - 5.11 MIL/uL   Hemoglobin 15.3 (H) 12.0 - 15.0 g/dL   HCT 16.144.2 09.636.0 - 04.546.0 %   MCV 90.4 78.0 - 100.0 fL   MCH 31.3 26.0 - 34.0 pg   MCHC 34.6 30.0 - 36.0 g/dL   RDW 40.914.0 81.111.5 - 91.415.5 %  Platelets 328 150 - 400 K/uL   Colpo in office:  Vulvar Bx: Recurrent VIN 3 at Rt post vulva/perineum                           AW/slightly raised lesion Rt ant labia minora  Assessment/Plan: 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  Surgery and risks reviewed.  Informed consent signed.  Brighten Buzzelli,MARIE-LYNE 01/07/2016, 9:07 AM

## 2016-01-07 NOTE — Anesthesia Procedure Notes (Signed)
Procedure Name: LMA Insertion Date/Time: 01/07/2016 9:24 AM Performed by: Suella GroveMOORE, Jamaya Sleeth C Pre-anesthesia Checklist: Patient identified, Emergency Drugs available, Suction available and Patient being monitored Patient Re-evaluated:Patient Re-evaluated prior to inductionOxygen Delivery Method: Circle system utilized and Simple face mask Preoxygenation: Pre-oxygenation with 100% oxygen Intubation Type: IV induction Ventilation: Mask ventilation without difficulty LMA: LMA inserted LMA Size: 4.0 Grade View: Grade II Number of attempts: 1 Placement Confirmation: positive ETCO2 and breath sounds checked- equal and bilateral Dental Injury: Teeth and Oropharynx as per pre-operative assessment

## 2016-01-07 NOTE — Discharge Instructions (Addendum)
Excision of Skin Lesions, Care After Refer to this sheet in the next few weeks. These instructions provide you with information about caring for yourself after your procedure. Your health care provider may also give you more specific instructions. Your treatment has been planned according to current medical practices, but problems sometimes occur. Call your health care provider if you have any problems or questions after your procedure. WHAT TO EXPECT AFTER THE PROCEDURE After your procedure, it is common to have pain or discomfort at the excision site. HOME CARE INSTRUCTIONS  Take over-the-counter and prescription medicines only as told by your health care provider.  Follow instructions from your health care provider about:  How to take care of your excision site. You should keep the site clean, dry, and protected for at least 48 hours.  When and how you should change your bandage (dressing).  When you should remove your dressing.  Removing whatever was used to close your excision site.  Check the excision area every day for signs of infection. Watch for:  Redness, swelling, or pain.  Fluid, blood, or pus.  For bleeding, apply gentle but firm pressure to the area using a folded towel for 20 minutes.  Avoid high-impact exercise and activities until the stitches (sutures) are removed or the area heals.  Follow instructions from your health care provider about how to minimize scarring. Avoid sun exposure until the area has healed. Scarring should lessen over time.  Keep all follow-up visits as told by your health care provider. This is important. SEEK MEDICAL CARE IF:  You have a fever.  You have redness, swelling, or pain at the excision site.  You have fluid, blood, or pus coming from the excision site.  You have ongoing bleeding at the excision site.  You have pain that does not improve in 2-3 days after your procedure.  You notice skin irregularities or changes in  sensation.   This information is not intended to replace advice given to you by your health care provider. Make sure you discuss any questions you have with your health care provider.   Document Released: 02/23/2015 Document Reviewed: 02/23/2015 Elsevier Interactive Patient Education 2016 [employer]  Post Anesthesia Home Care Instructions  NO IBUPROFEN PRODUCTS UNTIL 3:30PM  Activity: Get plenty of rest for the remainder of the day. A responsible adult should stay with you for 24 hours following the procedure.  For the next 24 hours, DO NOT: -Drive a car -Advertising copywriterperate machinery -Drink alcoholic beverages -Take any medication unless instructed by your physician -Make any legal decisions or sign important papers.  Meals: Start with liquid foods such as gelatin or soup. Progress to regular foods as tolerated. Avoid greasy, spicy, heavy foods. If nausea and/or vomiting occur, drink only clear liquids until the nausea and/or vomiting subsides. Call your physician if vomiting continues.  Special Instructions/Symptoms: Your throat may feel dry or sore from the anesthesia or the breathing tube placed in your throat during surgery. If this causes discomfort, gargle with warm salt water. The discomfort should disappear within 24 hours.

## 2016-01-07 NOTE — Anesthesia Preprocedure Evaluation (Signed)
Anesthesia Evaluation  Patient identified by MRN, date of birth, ID band Patient awake    Reviewed: Allergy & Precautions, H&P , NPO status , Patient's Chart, lab work & pertinent test results  History of Anesthesia Complications Negative for: history of anesthetic complications  Airway Mallampati: II  TM Distance: >3 FB Neck ROM: full    Dental no notable dental hx.    Pulmonary Current Smoker,    Pulmonary exam normal breath sounds clear to auscultation       Cardiovascular negative cardio ROS Normal cardiovascular exam Rhythm:regular Rate:Normal     Neuro/Psych negative neurological ROS     GI/Hepatic negative GI ROS, Neg liver ROS,   Endo/Other  negative endocrine ROS  Renal/GU negative Renal ROS     Musculoskeletal   Abdominal   Peds  Hematology negative hematology ROS (+)   Anesthesia Other Findings   Reproductive/Obstetrics negative OB ROS                             Anesthesia Physical Anesthesia Plan  ASA: II  Anesthesia Plan: General   Post-op Pain Management:    Induction: Intravenous  Airway Management Planned: LMA  Additional Equipment:   Intra-op Plan:   Post-operative Plan: Extubation in OR  Informed Consent: I have reviewed the patients History and Physical, chart, labs and discussed the procedure including the risks, benefits and alternatives for the proposed anesthesia with the patient or authorized representative who has indicated his/her understanding and acceptance.   Dental Advisory Given  Plan Discussed with: Anesthesiologist, CRNA and Surgeon  Anesthesia Plan Comments:         Anesthesia Quick Evaluation

## 2016-01-08 NOTE — Anesthesia Postprocedure Evaluation (Signed)
Anesthesia Post Note  Patient: Margaret Perez  Procedure(s) Performed: Procedure(s) (LRB): CO2 LASER APPLICATION of Vulva (N/A) EXCISION VAGINAL/Vulvar LESION (N/A)  Patient location during evaluation: PACU Anesthesia Type: MAC Level of consciousness: awake and alert Pain management: pain level controlled Vital Signs Assessment: post-procedure vital signs reviewed and stable Respiratory status: spontaneous breathing, nonlabored ventilation, respiratory function stable and patient connected to nasal cannula oxygen Cardiovascular status: stable and blood pressure returned to baseline Anesthetic complications: no    Last Vitals:  Filed Vitals:   01/07/16 1130 01/07/16 1210  BP: 121/87 126/82  Pulse: 74 80  Temp: 36.5 C 36.6 C  Resp: 19 20    Last Pain:  Filed Vitals:   01/07/16 1237  PainSc: 0-No pain                 Lydian Chavous EDWARD

## 2016-01-10 ENCOUNTER — Encounter (HOSPITAL_COMMUNITY): Payer: Self-pay | Admitting: Obstetrics & Gynecology

## 2016-11-16 IMAGING — CR DG WRIST COMPLETE 3+V*R*
3 series · 3 of 3 positions shown · non-contrast
Comparison: None.

CLINICAL DATA: Distal radial pain today, obvious swelling at site
of distal radius. Status post fall.

EXAM:
RIGHT WRIST - COMPLETE 3+ VIEW

[x wrist pa right]
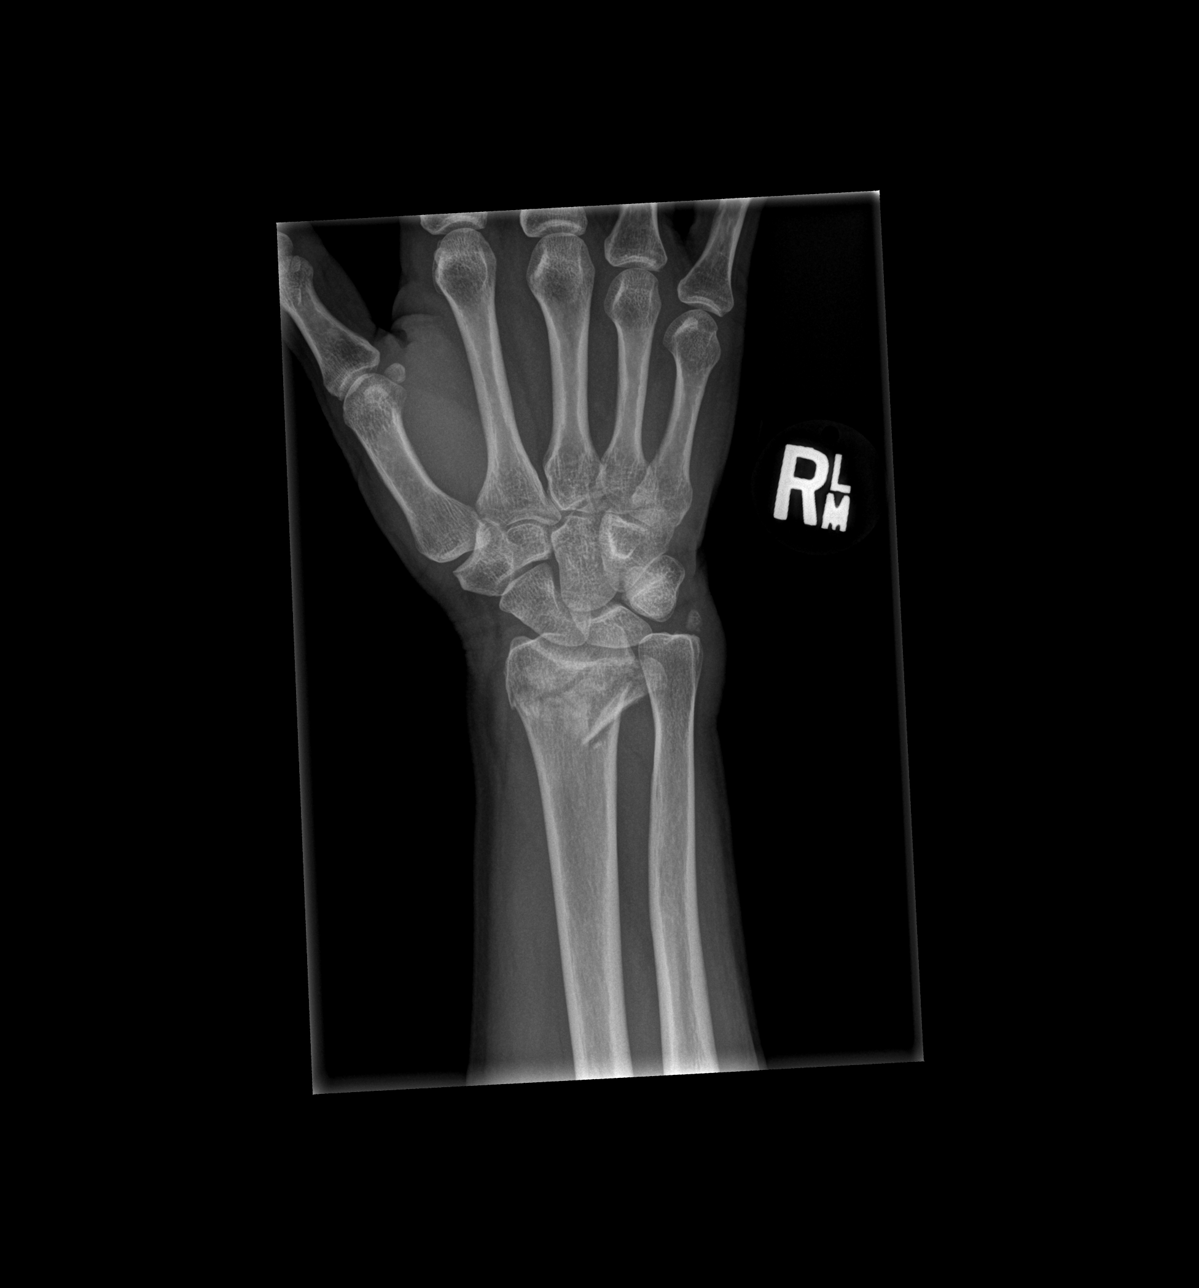

[x wrist obl right]
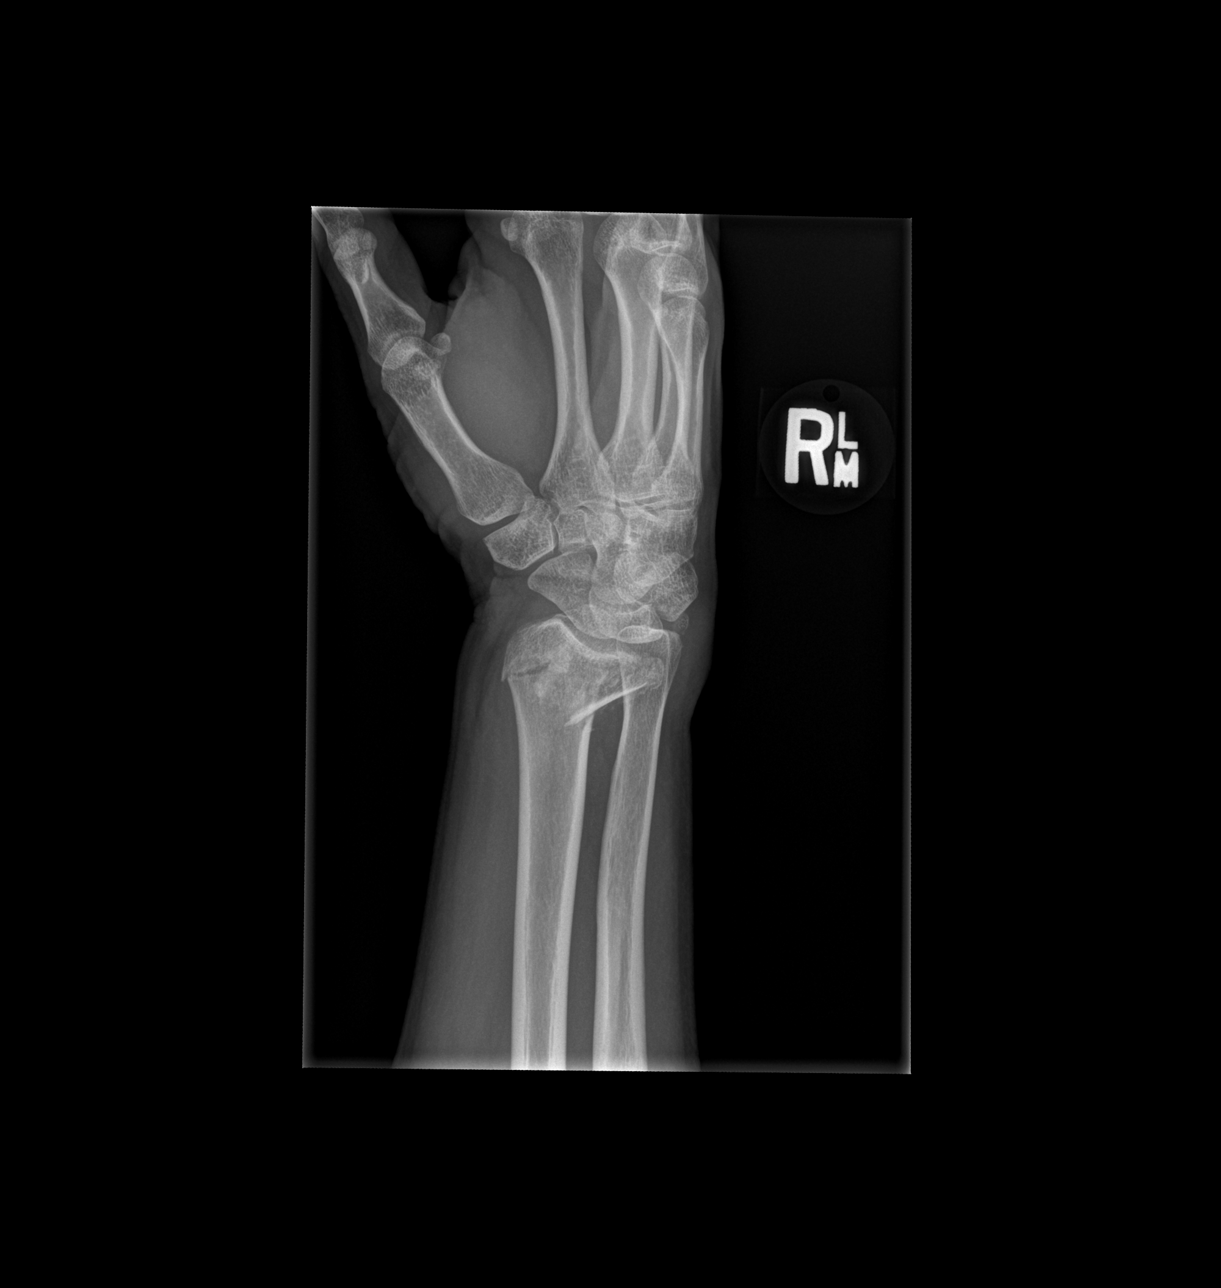

[x wrist lat right]
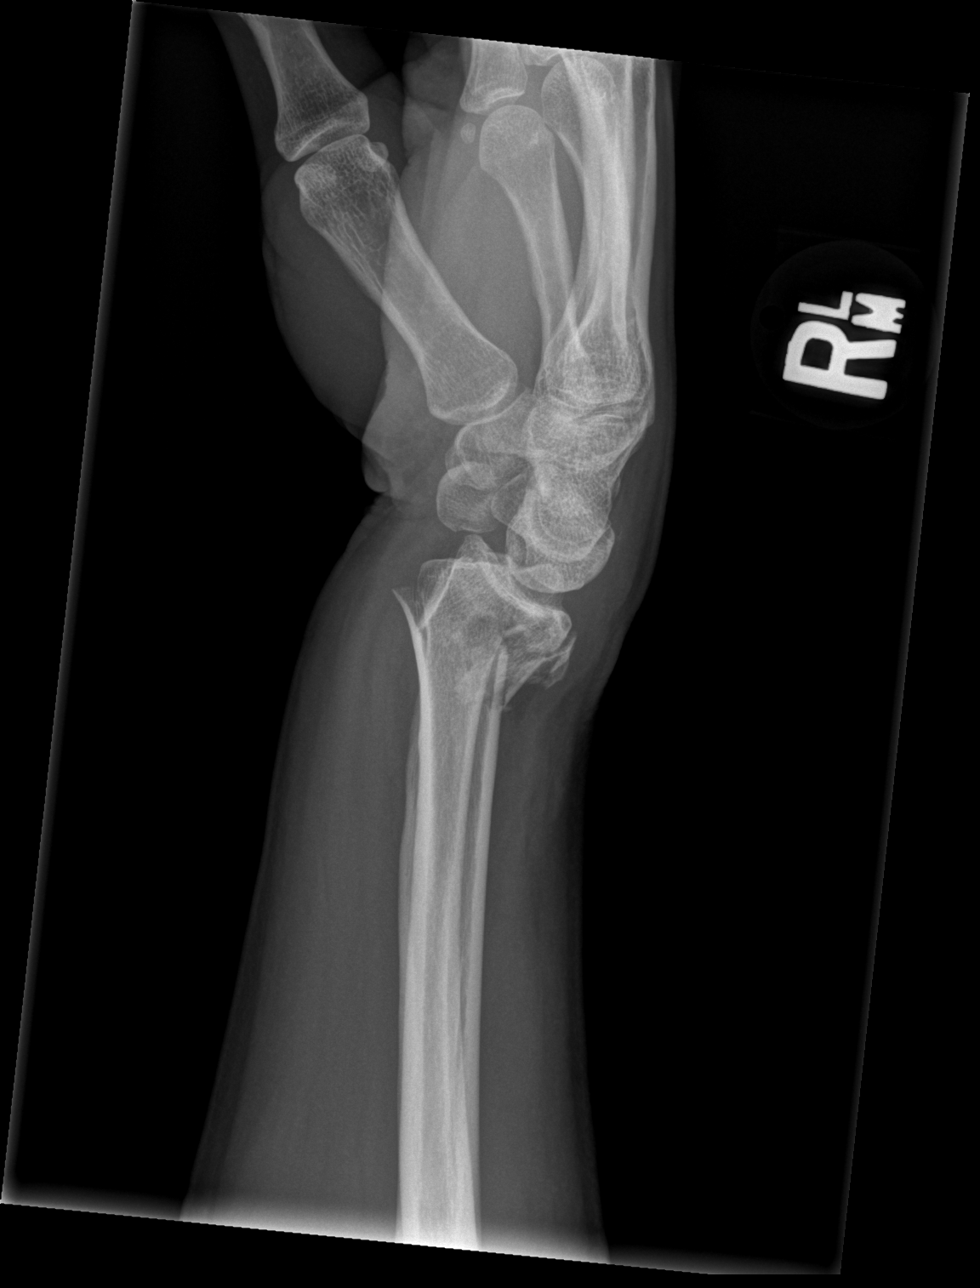

[3 of 3 positions shown; findings below may reference images not displayed]

FINDINGS: Displaced/comminuted fracture of the distal right radius. At least
mild subluxation of the distal ulna. Slightly displaced fracture of
the ulnar styloid process.

No fracture or dislocation seen amongst the carpal bones or proximal
metacarpal bones.
IMPRESSION: Displaced/comminuted fracture of the distal right radius. Associated
dorsal tilt at the radiocarpal joint space.

At least mild subluxation of the distal left ulna, suspicious for
associated ligamentous injury. Also slightly displaced fracture of
the ulnar styloid process.

No fracture or dislocation within the carpal bones.

## 2016-11-17 IMAGING — CR DG WRIST COMPLETE 3+V*R*
4 series · 4 of 4 positions shown · non-contrast
Comparison: Right wrist radiographs performed 08/21/2015

CLINICAL DATA: Status post reduction of distal radius fracture.
Initial encounter.

EXAM:
RIGHT WRIST - COMPLETE 3+ VIEW

[x wrist pa right]
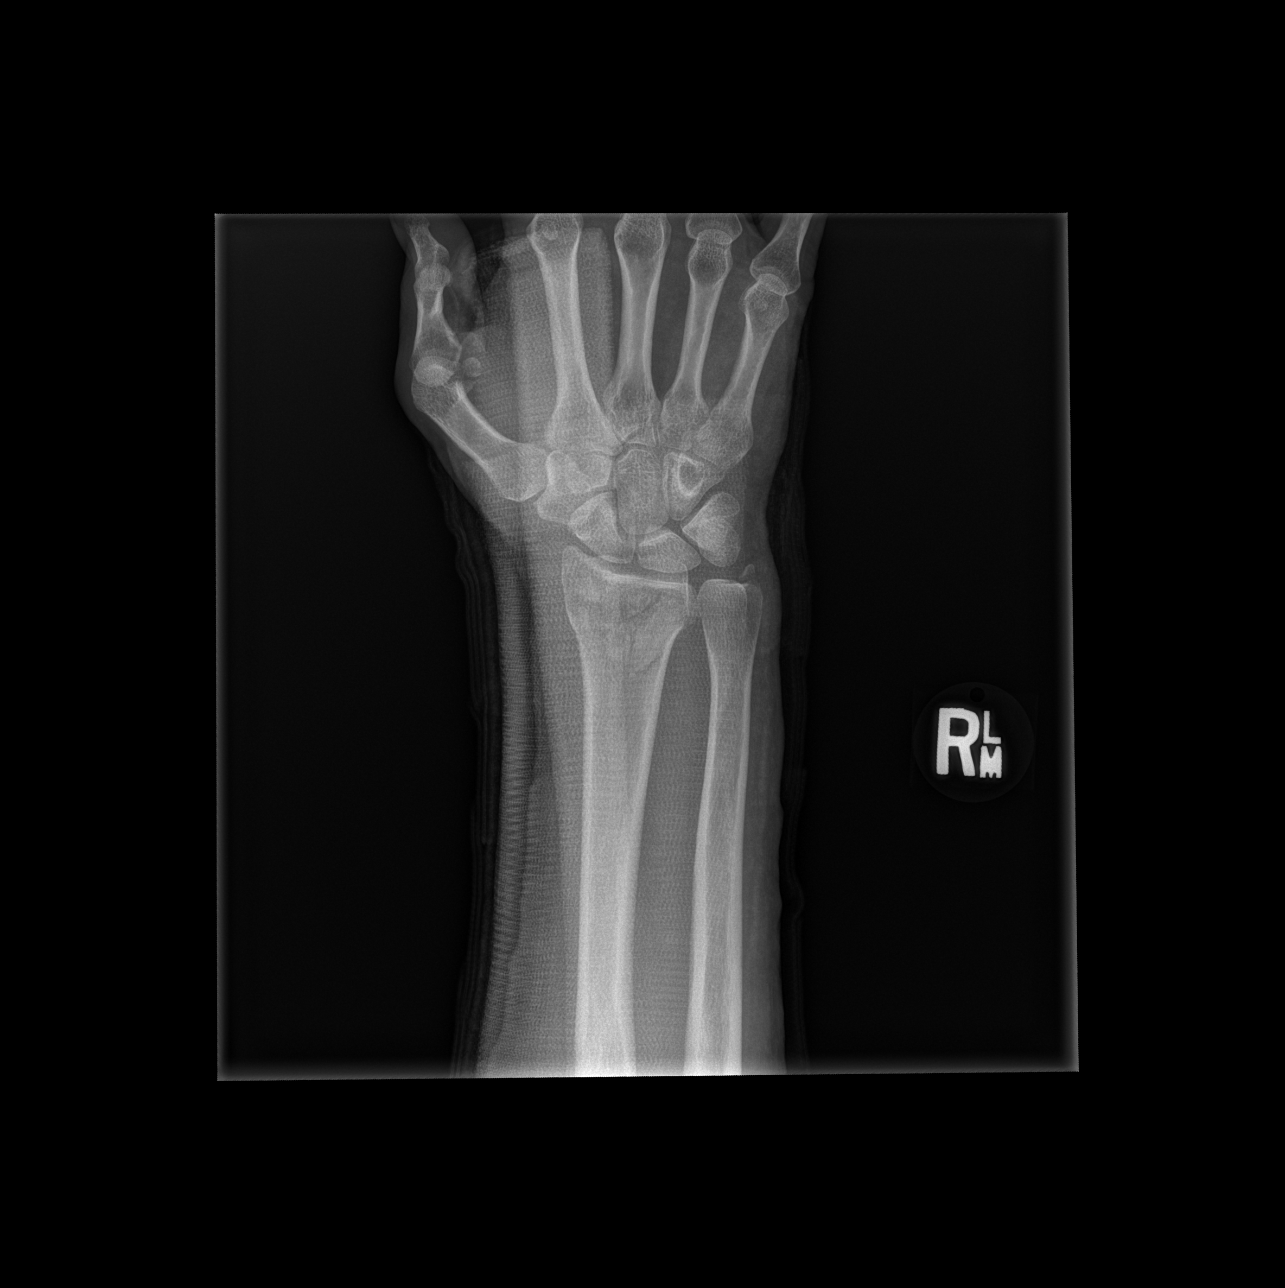

[x wrist obl right]
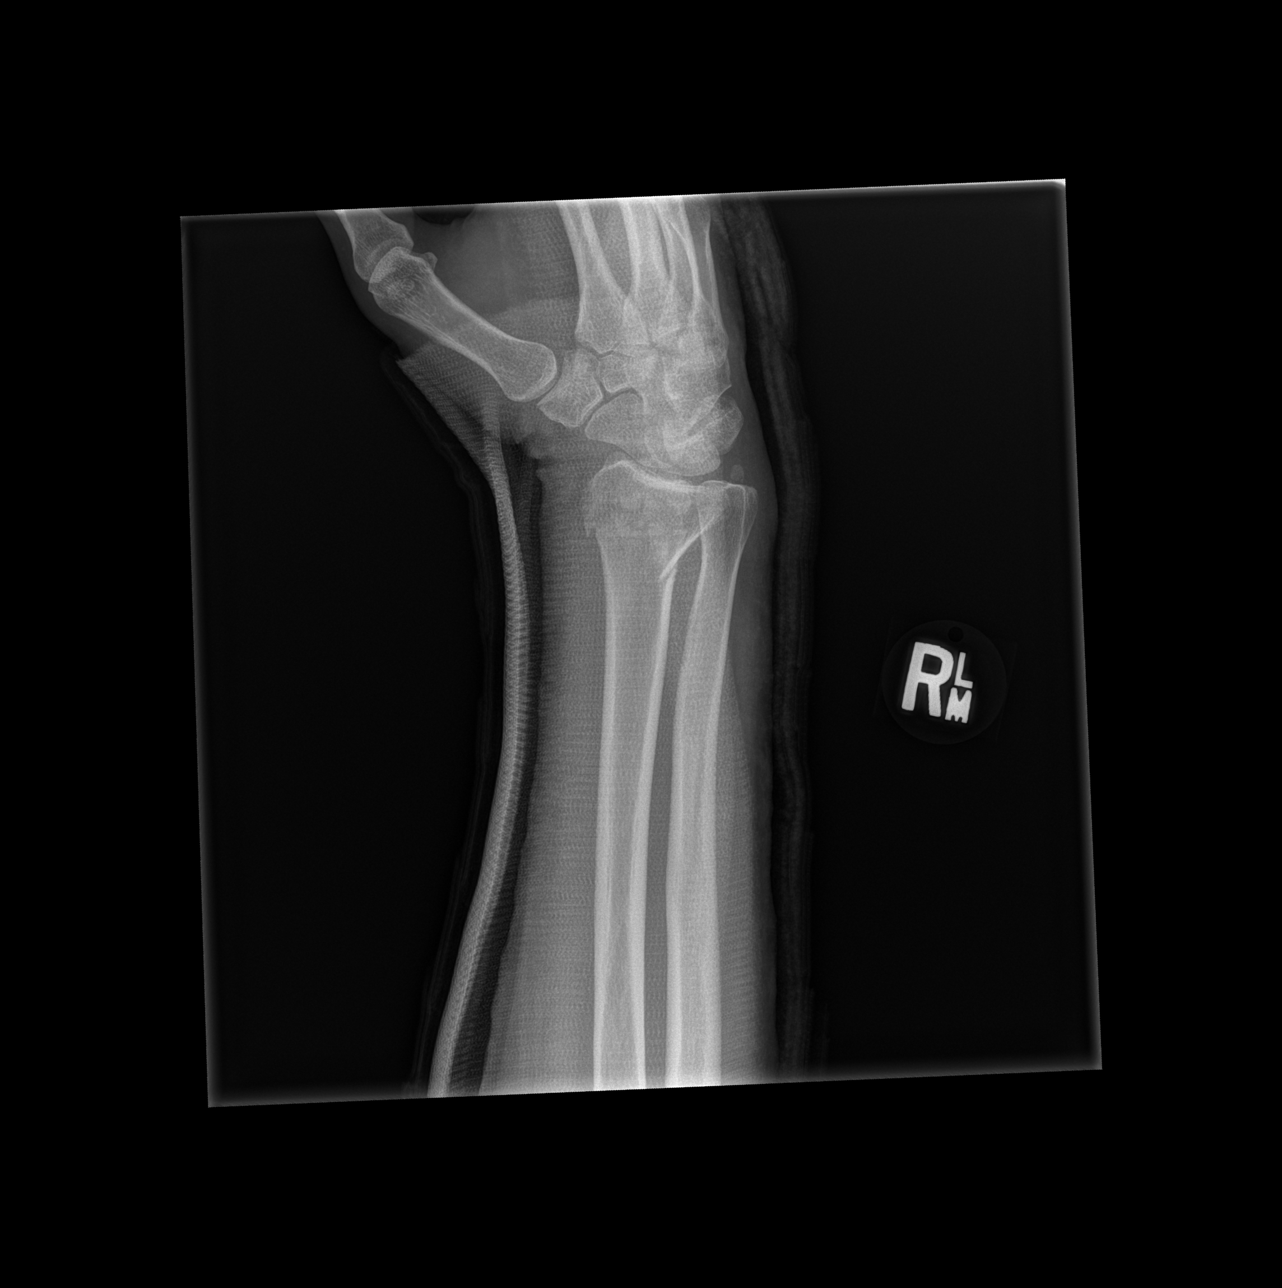

[x wrist lat right]
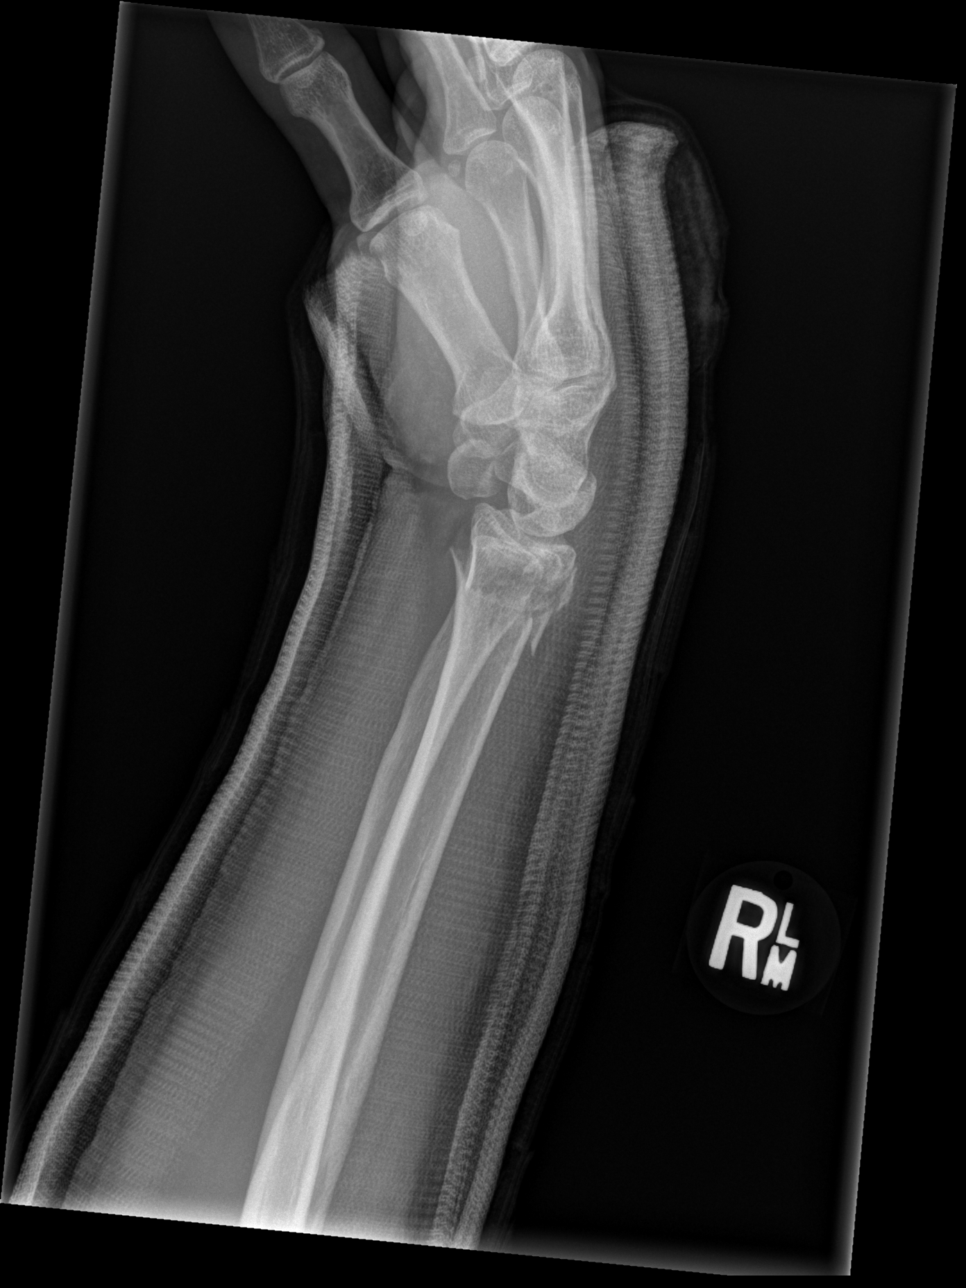

[x wrist navicular view right]
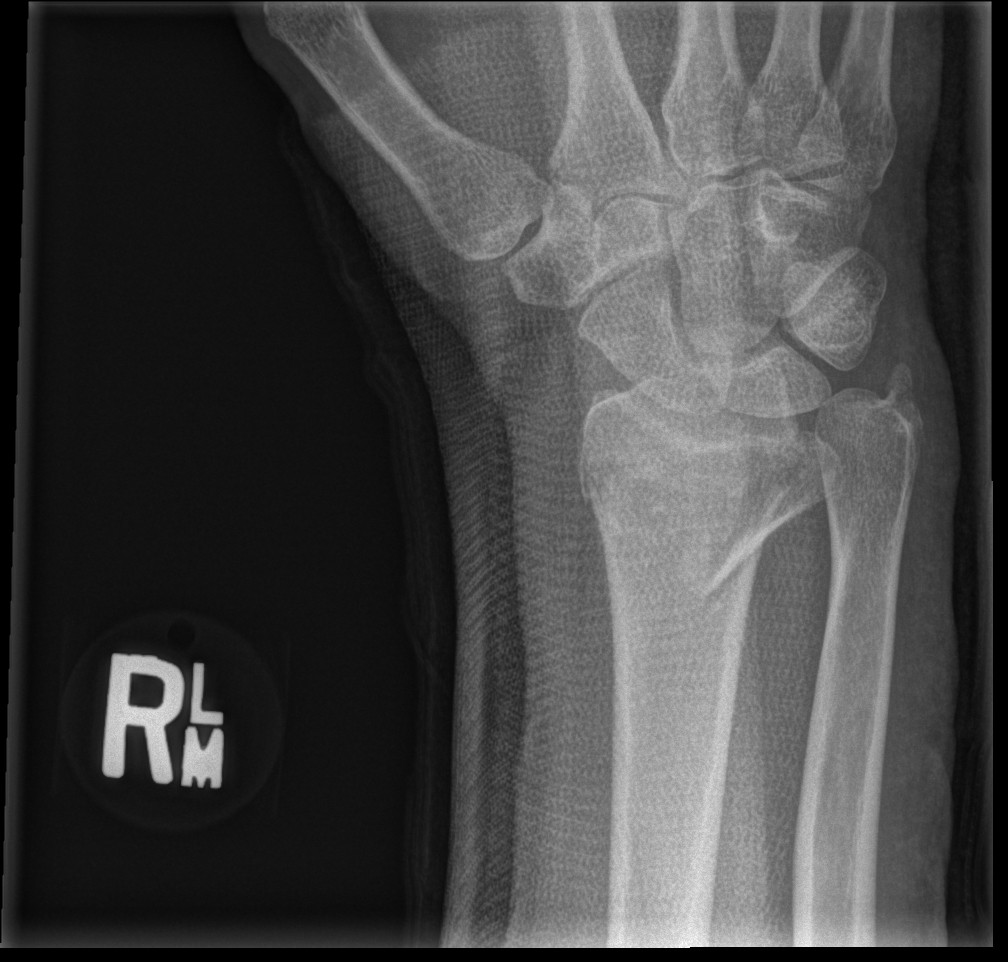

[4 of 4 positions shown; findings below may reference images not displayed]

FINDINGS: There is improved alignment of the distal radial fracture, with mild
residual dorsal displacement of distal radial fragments, and slight
dorsal tilt. An ulnar styloid fracture is again noted.

The carpal rows appear grossly intact, and demonstrate normal
alignment. Soft tissue swelling is noted about the fracture sites.
Visualized physes are within normal limits.
IMPRESSION: Improved alignment of the distal radial fracture, with mild residual
dorsal displacement of distal radial fragments, and slight dorsal
tilt. Ulnar styloid fracture again noted.

## 2017-07-26 ENCOUNTER — Telehealth: Payer: Self-pay | Admitting: *Deleted

## 2017-07-26 NOTE — Telephone Encounter (Signed)
Patient called asking if C&B can be schedule on 07/30/17, pt scheduled for annual on this date. I explained to patient I didn't see any documentation for repeat C&B, last done in 05/2016 at [facility] office. I told pt keep scheduled annual appointment on 07/30/17 and Dr.Lavoie will examine if C&B needed this day she will let her know. Patient asked why procedure couldn't "just be scheduled" once again I explained I didn't see in paper chart the order to repeat C&B, I also explained to her annual time slots and procedure time slots are booked different. Pt verbalized she understood states she wants to be "pro-active" I told her I totally understand. Pt will be seen for annual.

## 2017-07-30 ENCOUNTER — Encounter: Payer: Self-pay | Admitting: Obstetrics & Gynecology

## 2017-07-30 ENCOUNTER — Ambulatory Visit (INDEPENDENT_AMBULATORY_CARE_PROVIDER_SITE_OTHER): Payer: BLUE CROSS/BLUE SHIELD | Admitting: Obstetrics & Gynecology

## 2017-07-30 VITALS — BP 130/70 | Ht 68.0 in | Wt 171.0 lb

## 2017-07-30 DIAGNOSIS — Z1151 Encounter for screening for human papillomavirus (HPV): Secondary | ICD-10-CM

## 2017-07-30 DIAGNOSIS — Z01411 Encounter for gynecological examination (general) (routine) with abnormal findings: Secondary | ICD-10-CM | POA: Diagnosis not present

## 2017-07-30 DIAGNOSIS — F329 Major depressive disorder, single episode, unspecified: Secondary | ICD-10-CM

## 2017-07-30 DIAGNOSIS — F419 Anxiety disorder, unspecified: Secondary | ICD-10-CM

## 2017-07-30 DIAGNOSIS — D071 Carcinoma in situ of vulva: Secondary | ICD-10-CM

## 2017-07-30 DIAGNOSIS — Z30013 Encounter for initial prescription of injectable contraceptive: Secondary | ICD-10-CM | POA: Diagnosis not present

## 2017-07-30 DIAGNOSIS — Z720 Tobacco use: Secondary | ICD-10-CM

## 2017-07-30 MED ORDER — BUPROPION HCL ER (XL) 150 MG PO TB24: 150.0000 mg | ORAL_TABLET | Freq: Every day | ORAL | 5 refills | Status: DC

## 2017-07-30 MED ORDER — MEDROXYPROGESTERONE ACETATE 150 MG/ML IM SUSP: 150.0000 mg | Freq: Once | INTRAMUSCULAR | 4 refills | Status: DC

## 2017-07-30 MED ORDER — MEDROXYPROGESTERONE ACETATE 150 MG/ML IM SUSP
150.0000 mg | Freq: Once | INTRAMUSCULAR | Status: AC
  Administered 2017-07-30: 150 mg via INTRAMUSCULAR

## 2017-07-30 NOTE — Progress Notes (Signed)
Margaret Perez Nov 27, 1970 [id number]   History:    46 y.o. G2P1A1L1 Single  RP:  Established patient presenting for annual gyn exam   HPI:  Menses reg normal.  Abstinent currently.  Would like to start back on DepoProvera.  No pelvic pain.  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  Breasts wnl.  Mictions/BMs wnl.  Cigarette smoker 1 ppd.  Minimal physical activity.  Lost her job, looking for a job.  C/O Anxiety.  No major depressive Sx, no suicidal ideation.    Past medical history,surgical history, family history and social history were all reviewed and documented in the EPIC chart.  Gynecologic History Patient's last menstrual period was 07/18/2017. Contraception: abstinence Last Pap: 2017. Results were: normal Last mammogram: 2018. Results were: normal  Obstetric History OB History  Gravida Para Term Preterm AB Living  SAB TAB Ectopic Multiple Live Births               # Outcome Date GA Lbr Len/2nd Weight Sex Delivery Anes PTL Lv  2 AB           1 Para                ROS: A ROS was performed and pertinent positives and negatives are included in the history.  GENERAL: No fevers or chills. HEENT: No change in vision, no earache, sore throat or sinus congestion. NECK: No pain or stiffness. CARDIOVASCULAR: No chest pain or pressure. No palpitations. PULMONARY: No shortness of breath, cough or wheeze. GASTROINTESTINAL: No abdominal pain, nausea, vomiting or diarrhea, melena or bright red blood per rectum. GENITOURINARY: No urinary frequency, urgency, hesitancy or dysuria. MUSCULOSKELETAL: No joint or muscle pain, no back pain, no recent trauma. DERMATOLOGIC: No rash, no itching, no lesions. ENDOCRINE: No polyuria, polydipsia, no heat or cold intolerance. No recent change in weight. HEMATOLOGICAL: No anemia or easy bruising or bleeding. NEUROLOGIC: No headache, seizures, numbness, tingling or weakness. PSYCHIATRIC: No depression, no loss of interest in normal  activity or change in sleep pattern.     Exam:   BP 130/70   Ht  (1.727 m)   Wt 171 lb (77.6 kg)   LMP 07/18/2017   BMI 26.00 kg/m   Body mass index is 26 kg/m.  General appearance : Well developed well nourished female. No acute distress HEENT: Eyes: no retinal hemorrhage or exudates,  Neck supple, trachea midline, no carotid bruits, no thyroidmegaly Lungs: Clear to auscultation, no rhonchi or wheezes, or rib retractions  Heart: Regular rate and rhythm, no murmurs or gallops Breast:Examined in sitting and supine position were symmetrical in appearance, no palpable masses or tenderness,  no skin retraction, no nipple inversion, no nipple discharge, no skin discoloration, no axillary or supraclavicular lymphadenopathy Abdomen: no palpable masses or tenderness, no rebound or guarding Extremities: no edema or skin discoloration or tenderness  Pelvic: Vulva normal  Bartholin, Urethra, Skene Glands: Within normal limits             Vagina: No gross lesions or discharge.  Scarring from previous surgery/laser for VIN 3.  Cervix: No gross lesions or discharge.  Pap/HPV done.  Uterus  AV, normal size, shape and consistency, non-tender and mobile  Adnexa  Without masses or tenderness  Anus and perineum  normal      Assessment/Plan:  46 y.o. female for annual exam   1. Encounter for gynecological examination with abnormal finding Normal gyn  exam.  Pap/HPV HR done.  Breasts wnl.   - PAP,TP IMGw/HPV RNA,rflx HPVTYPE16,18/45  2. Encounter for initial prescription of injectable contraceptive Well on contraception with DepoProvera in the past, wants to start back on it.  Risks/Benefits/Usage previously reviewed. - medroxyPROGESTERone (DEPO-PROVERA) injection 150 mg; Inject 1 mL (150 mg total) into the muscle once.  3. 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  4. Anxiety and depression Started on Wellbutrin XL 150 mg daily.  Evaluation/management  with Psychiatrist.  No suicidal ideation.  5. Tobacco abuse Strongly recommended to progressively decrease the number of cigarettes smoked per day.  Then set a goal for cessation when in a more stable, less stressful situation.  6. Special screening examination for human papillomavirus (HPV)  - PAP,TP IMGw/HPV RNA,rflx HPVTYPE16,18/45  Counseling on above issues >50% x 15 minutes.  [name] MD, 11:50 AM 07/30/2017

## 2017-07-31 LAB — PAP, TP IMAGING W/ HPV RNA, RFLX HPV TYPE 16,18/45: HPV DNA HIGH RISK: NOT DETECTED

## 2017-08-04 NOTE — Patient Instructions (Signed)
1. Encounter for gynecological examination with abnormal finding Normal gyn exam.  Pap/HPV HR done.  Breasts wnl.   - PAP,TP IMGw/HPV RNA,rflx HPVTYPE16,18/45  2. Encounter for initial prescription of injectable contraceptive Well on contraception with DepoProvera in the past, wants to start back on it.  Risks/Benefits/Usage previously reviewed. - medroxyPROGESTERone (DEPO-PROVERA) injection 150 mg; Inject 1 mL (150 mg total) into the muscle once.  3. 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  4. Anxiety and depression Started on Wellbutrin XL 150 mg daily.  Evaluation/management with Psychiatrist.  No suicidal ideation.  5. Tobacco abuse Strongly recommended to progressively decrease the number of cigarettes smoked per day.  Then set a goal for cessation when in a more stable, less stressful situation.  6. Special screening examination for human papillomavirus (HPV)  - PAP,TP IMGw/HPV RNA,rflx HPVTYPE16,18/45  Margaret Perez, it was a pleasure to see you today!  I will inform you of your results as soon as available.  Health Maintenance, Female Adopting a healthy lifestyle and getting preventive care can go a long way to promote health and wellness. Talk with your health care provider about what schedule of regular examinations is right for you. This is a good chance for you to check in with your provider about disease prevention and staying healthy. In between checkups, there are plenty of things you can do on your own. Experts have done a lot of research about which lifestyle changes and preventive measures are most likely to keep you healthy. Ask your health care provider for more information. Weight and diet Eat a healthy diet  Be sure to include plenty of vegetables, fruits, low-fat dairy products, and lean protein.  Do not eat a lot of foods high in solid fats, added sugars, or salt.  Get regular exercise. This is one of the most important things you can do  for your health. ? Most adults should exercise for at least 150 minutes each week. The exercise should increase your heart rate and make you sweat (moderate-intensity exercise). ? Most adults should also do strengthening exercises at least twice a week. This is in addition to the moderate-intensity exercise.  Maintain a healthy weight  Body mass index (BMI) is a measurement that can be used to identify possible weight problems. It estimates body fat based on height and weight. Your health care provider can help determine your BMI and help you achieve or maintain a healthy weight.  For females 76 years of age and older: ? A BMI below 18.5 is considered underweight. ? A BMI of 18.5 to 24.9 is normal. ? A BMI of 25 to 29.9 is considered overweight. ? A BMI of 30 and above is considered obese.  Watch levels of cholesterol and blood lipids  You should start having your blood tested for lipids and cholesterol at 47 years of age, then have this test every 5 years.  You may need to have your cholesterol levels checked more often if: ? Your lipid or cholesterol levels are high. ? You are older than 47 years of age. ? You are at high risk for heart disease.  Cancer screening Lung Cancer  Lung cancer screening is recommended for adults 35-30 years old who are at high risk for lung cancer because of a history of smoking.  A yearly low-dose CT scan of the lungs is recommended for people who: ? Currently smoke. ? Have quit within the past 15 years. ? Have at least a 30-pack-year history of  smoking. A pack year is smoking an average of one pack of cigarettes a day for 1 year.  Yearly screening should continue until it has been 15 years since you quit.  Yearly screening should stop if you develop a health problem that would prevent you from having lung cancer treatment.  Breast Cancer  Practice breast self-awareness. This means understanding how your breasts normally appear and feel.  It  also means doing regular breast self-exams. Let your health care provider know about any changes, no matter how small.  If you are in your 20s or 30s, you should have a clinical breast exam (CBE) by a health care provider every 1-3 years as part of a regular health exam.  If you are 31 or older, have a CBE every year. Also consider having a breast X-ray (mammogram) every year.  If you have a family history of breast cancer, talk to your health care provider about genetic screening.  If you are at high risk for breast cancer, talk to your health care provider about having an MRI and a mammogram every year.  Breast cancer gene (BRCA) assessment is recommended for women who have family members with BRCA-related cancers. BRCA-related cancers include: ? Breast. ? Ovarian. ? Tubal. ? Peritoneal cancers.  Results of the assessment will determine the need for genetic counseling and BRCA1 and BRCA2 testing.  Cervical Cancer Your health care provider may recommend that you be screened regularly for cancer of the pelvic organs (ovaries, uterus, and vagina). This screening involves a pelvic examination, including checking for microscopic changes to the surface of your cervix (Pap test). You may be encouraged to have this screening done every 3 years, beginning at age 54.  For women ages 95-65, health care providers may recommend pelvic exams and Pap testing every 3 years, or they may recommend the Pap and pelvic exam, combined with testing for human papilloma virus (HPV), every 5 years. Some types of HPV increase your risk of cervical cancer. Testing for HPV may also be done on women of any age with unclear Pap test results.  Other health care providers may not recommend any screening for nonpregnant women who are considered low risk for pelvic cancer and who do not have symptoms. Ask your health care provider if a screening pelvic exam is right for you.  If you have had past treatment for cervical  cancer or a condition that could lead to cancer, you need Pap tests and screening for cancer for at least 20 years after your treatment. If Pap tests have been discontinued, your risk factors (such as having a new sexual partner) need to be reassessed to determine if screening should resume. Some women have medical problems that increase the chance of getting cervical cancer. In these cases, your health care provider may recommend more frequent screening and Pap tests.  Colorectal Cancer  This type of cancer can be detected and often prevented.  Routine colorectal cancer screening usually begins at 47 years of age and continues through 47 years of age.  Your health care provider may recommend screening at an earlier age if you have risk factors for colon cancer.  Your health care provider may also recommend using home test kits to check for hidden blood in the stool.  A small camera at the end of a tube can be used to examine your colon directly (sigmoidoscopy or colonoscopy). This is done to check for the earliest forms of colorectal cancer.  Routine screening usually begins at  age 21.  Direct examination of the colon should be repeated every 5-10 years through 47 years of age. However, you may need to be screened more often if early forms of precancerous polyps or small growths are found.  Skin Cancer  Check your skin from head to toe regularly.  Tell your health care provider about any new moles or changes in moles, especially if there is a change in a mole's shape or color.  Also tell your health care provider if you have a mole that is larger than the size of a pencil eraser.  Always use sunscreen. Apply sunscreen liberally and repeatedly throughout the day.  Protect yourself by wearing long sleeves, pants, a wide-brimmed hat, and sunglasses whenever you are outside.  Heart disease, diabetes, and high blood pressure  High blood pressure causes heart disease and increases the risk  of stroke. High blood pressure is more likely to develop in: ? People who have blood pressure in the high end of the normal range (130-139/85-89 mm Hg). ? People who are overweight or obese. ? People who are African American.  If you are 26-29 years of age, have your blood pressure checked every 3-5 years. If you are 100 years of age or older, have your blood pressure checked every year. You should have your blood pressure measured twice-once when you are at a hospital or clinic, and once when you are not at a hospital or clinic. Record the average of the two measurements. To check your blood pressure when you are not at a hospital or clinic, you can use: ? An automated blood pressure machine at a pharmacy. ? A home blood pressure monitor.  If you are between 24 years and 8 years old, ask your health care provider if you should take aspirin to prevent strokes.  Have regular diabetes screenings. This involves taking a blood sample to check your fasting blood sugar level. ? If you are at a normal weight and have a low risk for diabetes, have this test once every three years after 47 years of age. ? If you are overweight and have a high risk for diabetes, consider being tested at a younger age or more often. Preventing infection Hepatitis B  If you have a higher risk for hepatitis B, you should be screened for this virus. You are considered at high risk for hepatitis B if: ? You were born in a country where hepatitis B is common. Ask your health care provider which countries are considered high risk. ? Your parents were born in a high-risk country, and you have not been immunized against hepatitis B (hepatitis B vaccine). ? You have HIV or AIDS. ? You use needles to inject street drugs. ? You live with someone who has hepatitis B. ? You have had sex with someone who has hepatitis B. ? You get hemodialysis treatment. ? You take certain medicines for conditions, including cancer, organ  transplantation, and autoimmune conditions.  Hepatitis C  Blood testing is recommended for: ? Everyone born from [birth year] through 1966. ? Anyone with known risk factors for hepatitis C.  Sexually transmitted infections (STIs)  You should be screened for sexually transmitted infections (STIs) including gonorrhea and chlamydia if: ? You are sexually active and are younger than 47 years of age. ? You are older than 47 years of age and your health care provider tells you that you are at risk for this type of infection. ? Your sexual activity has changed since you were  last screened and you are at an increased risk for chlamydia or gonorrhea. Ask your health care provider if you are at risk.  If you do not have HIV, but are at risk, it may be recommended that you take a prescription medicine daily to prevent HIV infection. This is called pre-exposure prophylaxis (PrEP). You are considered at risk if: ? You are sexually active and do not regularly use condoms or know the HIV status of your partner(s). ? You take drugs by injection. ? You are sexually active with a partner who has HIV.  Talk with your health care provider about whether you are at high risk of being infected with HIV. If you choose to begin PrEP, you should first be tested for HIV. You should then be tested every 3 months for as long as you are taking PrEP. Pregnancy  If you are premenopausal and you may become pregnant, ask your health care provider about preconception counseling.  If you may become pregnant, take 400 to 800 micrograms (mcg) of folic acid every day.  If you want to prevent pregnancy, talk to your health care provider about birth control (contraception). Osteoporosis and menopause  Osteoporosis is a disease in which the bones lose minerals and strength with aging. This can result in serious bone fractures. Your risk for osteoporosis can be identified using a bone density scan.  If you are 93 years of age or  older, or if you are at risk for osteoporosis and fractures, ask your health care provider if you should be screened.  Ask your health care provider whether you should take a calcium or vitamin D supplement to lower your risk for osteoporosis.  Menopause may have certain physical symptoms and risks.  Hormone replacement therapy may reduce some of these symptoms and risks. Talk to your health care provider about whether hormone replacement therapy is right for you. Follow these instructions at home:  Schedule regular health, dental, and eye exams.  Stay current with your immunizations.  Do not use any tobacco products including cigarettes, chewing tobacco, or electronic cigarettes.  If you are pregnant, do not drink alcohol.  If you are breastfeeding, limit how much and how often you drink alcohol.  Limit alcohol intake to no more than 1 drink per day for nonpregnant women. One drink equals 12 ounces of beer, 5 ounces of wine, or 1 ounces of hard liquor.  Do not use street drugs.  Do not share needles.  Ask your health care provider for help if you need support or information about quitting drugs.  Tell your health care provider if you often feel depressed.  Tell your health care provider if you have ever been abused or do not feel safe at home. This information is not intended to replace advice given to you by your health care provider. Make sure you discuss any questions you have with your health care provider. Document Released: 04/24/2011 Document Revised: 03/16/2016 Document Reviewed: 07/13/2015 Elsevier Interactive Patient Education  [employer].

## 2017-08-07 ENCOUNTER — Encounter: Payer: Self-pay | Admitting: Obstetrics & Gynecology

## 2017-08-07 ENCOUNTER — Ambulatory Visit (INDEPENDENT_AMBULATORY_CARE_PROVIDER_SITE_OTHER): Payer: BLUE CROSS/BLUE SHIELD | Admitting: Obstetrics & Gynecology

## 2017-08-07 VITALS — BP 136/88

## 2017-08-07 DIAGNOSIS — D071 Carcinoma in situ of vulva: Secondary | ICD-10-CM | POA: Diagnosis not present

## 2017-08-07 DIAGNOSIS — R87610 Atypical squamous cells of undetermined significance on cytologic smear of cervix (ASC-US): Secondary | ICD-10-CM

## 2017-08-07 NOTE — Progress Notes (Signed)
    Margaret Perez 1971/06/16 [id number]        46 y.o.  G2P0011   RP:  H/O VIN 3 for Vulvar Colpo  HPI:  No change since Annual/Gyn visit on 07/30/2017:  Menses reg normal.  Abstinent currently.  Started back on DepoProvera 07/30/2017.  No pelvic pain.  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  Breasts wnl.  Mictions/BMs wnl.  Cigarette smoker 1 ppd.  Past medical history,surgical history, problem list, medications, allergies, family history and social history were all reviewed and documented in the EPIC chart.  Directed ROS with pertinent positives and negatives documented in the history of present illness/assessment and plan.  Exam:  Vitals:   08/07/17 1125  BP: 136/88   General appearance:  Normal  Colposcopy Procedure Note Margaret Perez 08/07/2017  Indications:  H/O VIN 3 and ASCUS/HPV HR neg  Procedure Details  The risks and benefits of the procedure and Verbal informed consent obtained.  Speculum placed in vagina and excellent visualization of cervix achieved, cervix swabbed x 3 with acetic acid solution.  Findings:  Cervix colposcopy:  Physical Exam  Genitourinary:         Vaginal colposcopy: Neg  Vulvar colposcopy:  Physical Exam  Genitourinary:         Perirectal colposcopy:  Neg   Specimens: Cervical Bx at 2 O'clock  Complications:  None, hemostasis with Silver Nitrate .  Plan:  Repeat Vulvar Colpo in 4 months, Cervical colpo per Bx results.   Assessment/Plan:  46 y.o. G2P0011   1. Vulvar intraepithelial neoplasia (VIN) grade 3 Vulvar Colpo today reassuring.  Mild scarring vs VIN 1.  No Biopsy taken.  Will repeat Vulvar Colpo in 4 months.  Restart Folic Acid supplement.  Limit Alcohol and Quit smoking strongly recommended.  Was prescribed Wellbutrin, but will not start because of risks of side effects with Alcohol.  2. ASCUS of cervix with negative high risk HPV Pending Cervical Bx.   [name] MD, 11:46 AM  08/07/2017

## 2017-08-07 NOTE — Patient Instructions (Signed)
1. Vulvar intraepithelial neoplasia (VIN) grade 3 Vulvar Colpo today reassuring.  Mild scarring vs VIN 1.  No Biopsy taken.  Will repeat Vulvar Colpo in 4 months.  Restart Folic Acid supplement.  Limit Alcohol and Quit smoking strongly recommended.  Was prescribed Wellbutrin, but will not start because of risks of side effects with Alcohol.  2. ASCUS of cervix with negative high risk HPV Pending Cervical Bx.  Nasira, good to see you today!  I will inform you of your results as soon as available.

## 2017-08-09 LAB — TISSUE SPECIMEN

## 2017-08-09 LAB — PATHOLOGY

## 2017-10-25 ENCOUNTER — Ambulatory Visit (INDEPENDENT_AMBULATORY_CARE_PROVIDER_SITE_OTHER): Payer: BLUE CROSS/BLUE SHIELD | Admitting: Anesthesiology

## 2017-10-25 DIAGNOSIS — Z3042 Encounter for surveillance of injectable contraceptive: Secondary | ICD-10-CM | POA: Diagnosis not present

## 2017-10-25 MED ORDER — MEDROXYPROGESTERONE ACETATE 150 MG/ML IM SUSP
150.0000 mg | Freq: Once | INTRAMUSCULAR | Status: AC
  Administered 2017-10-25: 150 mg via INTRAMUSCULAR

## 2018-01-16 ENCOUNTER — Ambulatory Visit (INDEPENDENT_AMBULATORY_CARE_PROVIDER_SITE_OTHER): Payer: BLUE CROSS/BLUE SHIELD | Admitting: Anesthesiology

## 2018-01-16 DIAGNOSIS — Z3042 Encounter for surveillance of injectable contraceptive: Secondary | ICD-10-CM

## 2018-01-16 MED ORDER — MEDROXYPROGESTERONE ACETATE 150 MG/ML IM SUSP
150.0000 mg | Freq: Once | INTRAMUSCULAR | Status: AC
  Administered 2018-01-16: 150 mg via INTRAMUSCULAR

## 2018-04-16 ENCOUNTER — Ambulatory Visit (INDEPENDENT_AMBULATORY_CARE_PROVIDER_SITE_OTHER): Payer: BLUE CROSS/BLUE SHIELD | Admitting: Anesthesiology

## 2018-04-16 DIAGNOSIS — Z3042 Encounter for surveillance of injectable contraceptive: Secondary | ICD-10-CM | POA: Diagnosis not present

## 2018-04-16 MED ORDER — MEDROXYPROGESTERONE ACETATE 150 MG/ML IM SUSP
150.0000 mg | Freq: Once | INTRAMUSCULAR | Status: AC
  Administered 2018-04-16: 150 mg via INTRAMUSCULAR

## 2018-07-15 ENCOUNTER — Ambulatory Visit (INDEPENDENT_AMBULATORY_CARE_PROVIDER_SITE_OTHER): Payer: BLUE CROSS/BLUE SHIELD | Admitting: Anesthesiology

## 2018-07-15 DIAGNOSIS — Z3042 Encounter for surveillance of injectable contraceptive: Secondary | ICD-10-CM

## 2018-07-15 MED ORDER — MEDROXYPROGESTERONE ACETATE 150 MG/ML IM SUSP
150.0000 mg | Freq: Once | INTRAMUSCULAR | Status: AC
  Administered 2018-07-15: 150 mg via INTRAMUSCULAR

## 2018-08-09 ENCOUNTER — Ambulatory Visit: Payer: BLUE CROSS/BLUE SHIELD | Admitting: Obstetrics & Gynecology

## 2018-08-09 ENCOUNTER — Encounter: Payer: Self-pay | Admitting: Obstetrics & Gynecology

## 2018-08-09 VITALS — BP 122/84

## 2018-08-09 DIAGNOSIS — Z87412 Personal history of vulvar dysplasia: Secondary | ICD-10-CM

## 2018-08-09 DIAGNOSIS — Z87410 Personal history of cervical dysplasia: Secondary | ICD-10-CM | POA: Diagnosis not present

## 2018-08-09 NOTE — Patient Instructions (Signed)
1. H/O vulvar dysplasia H/O VIN 3 treated by Excision/Laser in 2017.  R/O recurrence of severe vulvar dysplasia, Bx taken on Rt vulva.  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  Management per Vulvar Bx result.  2. History of cervical dysplasia Pap/HPV HR done.  Minda, good seeing you today!  I will inform you of the results as soon as they are available.

## 2018-08-09 NOTE — Addendum Note (Signed)
Addended by: Berna Spare A on: 08/09/2018 11:48 AM   Modules accepted: Orders

## 2018-08-09 NOTE — Progress Notes (Signed)
    Margaret Perez 1970/11/21 [id number]        47 y.o.  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  On last vulvar colposcopy 07/2017, no evidence of severe vulvar dysplasia.  Colpo of cervix done 07/2017 for ASCUS/HPV HR neg, cervical patho CIN 1.   OB History  Gravida Para Term Preterm AB Living  2 1     1 1   SAB TAB Ectopic Multiple Live Births               # Outcome Date GA Lbr Len/2nd Weight Sex Delivery Anes PTL Lv  2 AB           1 Para             Past medical history,surgical history, problem list, medications, allergies, family history and social history were all reviewed and documented in the EPIC chart.   Directed ROS with pertinent positives and negatives documented in the history of present illness/assessment and plan.  Exam:  Vitals:   08/09/18 1053  BP: 122/84   General appearance:  Normal  Colposcopy Procedure Note Naquita D Mahabir 08/09/2018  Indications:  H/O VIN 3 post excision/laser in 2017 and CIN 1 07/2017  Procedure Details  The risks and benefits of the procedure and Verbal informed consent obtained.  Findings:  Cervix:  Pap/HR HPV done  Vulvar colposcopy: Acetic Acid swabs on vulva.  Physical Exam  Genitourinary:     Betadine prep, local anesthesia with Lido1%.  Punch Bx Rt vulva.  Closed with Vicryl 4-0.  Good hemostasis.   Perirectal colposcopy: Normal   Specimens: Rt Vulvar Bx.  Pap/HPV HR on cervix.  Complications: None.  Vulvar Bx site closed with Vicryl 4-0.  Good hemostasis. . Plan: Management per results    Assessment/Plan:  47 y.o. G2P0011   1. H/O vulvar dysplasia H/O VIN 3 treated by Excision/Laser in 2017.  R/O recurrence of severe vulvar dysplasia, Bx taken on Rt vulva.  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  Management per Vulvar Bx result.  2. History of cervical dysplasia Pap/HPV HR done.  [name] MD, 11:07 AM 08/09/2018

## 2018-08-13 LAB — PAP, TP IMAGING W/ HPV RNA, RFLX HPV TYPE 16,18/45: HPV DNA High Risk: NOT DETECTED

## 2018-08-13 LAB — PATHOLOGY

## 2018-08-13 LAB — TISSUE SPECIMEN

## 2018-10-06 ENCOUNTER — Other Ambulatory Visit: Payer: Self-pay | Admitting: Obstetrics & Gynecology

## 2018-10-08 ENCOUNTER — Ambulatory Visit (INDEPENDENT_AMBULATORY_CARE_PROVIDER_SITE_OTHER): Payer: No Typology Code available for payment source | Admitting: Anesthesiology

## 2018-10-08 DIAGNOSIS — Z3042 Encounter for surveillance of injectable contraceptive: Secondary | ICD-10-CM

## 2018-10-08 MED ORDER — MEDROXYPROGESTERONE ACETATE 150 MG/ML IM SUSP
150.0000 mg | Freq: Once | INTRAMUSCULAR | Status: AC
  Administered 2018-10-08: 150 mg via INTRAMUSCULAR

## 2019-01-02 ENCOUNTER — Encounter: Payer: Self-pay | Admitting: Obstetrics & Gynecology

## 2019-01-02 ENCOUNTER — Ambulatory Visit (INDEPENDENT_AMBULATORY_CARE_PROVIDER_SITE_OTHER): Payer: No Typology Code available for payment source | Admitting: Obstetrics & Gynecology

## 2019-01-02 ENCOUNTER — Other Ambulatory Visit: Payer: Self-pay

## 2019-01-02 VITALS — BP 128/82 | Ht 67.25 in | Wt 171.0 lb

## 2019-01-02 DIAGNOSIS — F17210 Nicotine dependence, cigarettes, uncomplicated: Secondary | ICD-10-CM

## 2019-01-02 DIAGNOSIS — D071 Carcinoma in situ of vulva: Secondary | ICD-10-CM

## 2019-01-02 DIAGNOSIS — Z3042 Encounter for surveillance of injectable contraceptive: Secondary | ICD-10-CM | POA: Diagnosis not present

## 2019-01-02 DIAGNOSIS — Z01419 Encounter for gynecological examination (general) (routine) without abnormal findings: Secondary | ICD-10-CM | POA: Diagnosis not present

## 2019-01-02 DIAGNOSIS — Z8544 Personal history of malignant neoplasm of other female genital organs: Secondary | ICD-10-CM

## 2019-01-02 MED ORDER — MEDROXYPROGESTERONE ACETATE 150 MG/ML IM SUSP: 150.0000 mg | INTRAMUSCULAR | 4 refills | Status: DC

## 2019-01-02 NOTE — Progress Notes (Signed)
Margaret Perez [birth year] [id number]   History:    48 y.o. G2P1A1L1 Single.  Unemployed  RP:  Established patient presenting for annual gyn exam   HPI: Well on DepoProvera injections.  Mild occasional vaginal spotting.  No pelvic pain.  Abstinent.  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  Urine/BMs normal.  Breasts normal.  BMI 26.58.  Not physically active.  Cigarette smoker 1 PPD.  Needs to have a Family MD.  Past medical history,surgical history, family history and social history were all reviewed and documented in the EPIC chart.  Gynecologic History No LMP recorded. Patient has had an injection. Contraception: Depo-Provera injections Last Pap: 07/2018. Results were: Negative/HPV HR neg Last mammogram: 08/2018.  Results were: Negative Bone Density: Never Colonoscopy: Never  Obstetric History OB History  Gravida Para Term Preterm AB Living  2 1     1 1   SAB TAB Ectopic Multiple Live Births               # Outcome Date GA Lbr Len/2nd Weight Sex Delivery Anes PTL Lv  2 AB           1 Para              ROS: A ROS was performed and pertinent positives and negatives are included in the history.  GENERAL: No fevers or chills. HEENT: No change in vision, no earache, sore throat or sinus congestion. NECK: No pain or stiffness. CARDIOVASCULAR: No chest pain or pressure. No palpitations. PULMONARY: No shortness of breath, cough or wheeze. GASTROINTESTINAL: No abdominal pain, nausea, vomiting or diarrhea, melena or bright red blood per rectum. GENITOURINARY: No urinary frequency, urgency, hesitancy or dysuria. MUSCULOSKELETAL: No joint or muscle pain, no back pain, no recent trauma. DERMATOLOGIC: No rash, no itching, no lesions. ENDOCRINE: No polyuria, polydipsia, no heat or cold intolerance. No recent change in weight. HEMATOLOGICAL: No anemia or easy bruising or bleeding. NEUROLOGIC: No headache, seizures, numbness, tingling or weakness. PSYCHIATRIC: No depression, no loss of interest in normal  activity or change in sleep pattern.     Exam:   BP 128/82   Ht 5' 7.25" (1.708 m)   Wt 171 lb (77.6 kg)   BMI 26.58 kg/m   Body mass index is 26.58 kg/m.  General appearance : Well developed well nourished female. No acute distress HEENT: Eyes: no retinal hemorrhage or exudates,  Neck supple, trachea midline, no carotid bruits, no thyroidmegaly Lungs: Clear to auscultation, no rhonchi or wheezes, or rib retractions  Heart: Regular rate and rhythm, no murmurs or gallops Breast:Examined in sitting and supine position were symmetrical in appearance, no palpable masses or tenderness,  no skin retraction, no nipple inversion, no nipple discharge, no skin discoloration, no axillary or supraclavicular lymphadenopathy Abdomen: no palpable masses or tenderness, no rebound or guarding Extremities: no edema or skin discoloration or tenderness  Pelvic: Vulva: Normal             Vagina: No gross lesions or discharge  Cervix: No gross lesions or discharge.  Pap reflex done.  Uterus  AV, normal size, shape and consistency, non-tender and mobile  Adnexa  Without masses or tenderness  Anus: Normal   Assessment/Plan:  48 y.o. female for annual exam   1. Encounter for routine gynecological examination with Papanicolaou smear of cervix Normal gynecologic exam.  Pap reflex done.  Breast exam normal.  Screening mammogram negative November 2019.  Will schedule a colonoscopy at age 89.  Recommend establishing with a  family physician.  2. Encounter for surveillance of injectable contraceptive Well on Depo-Provera injections.  No contraindication to continue.  Prescription sent to pharmacy.  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  4. Cigarette smoker Counseling on cigarette cessation done.  Other orders - medroxyPROGESTERone (DEPO-PROVERA) 150 MG/ML injection; Inject 1 mL (150 mg total) into the muscle every 3 (three) months.  [name] MD, 11:46 AM  01/02/2019

## 2019-01-03 LAB — PAP IG W/ RFLX HPV ASCU

## 2019-01-05 ENCOUNTER — Encounter: Payer: Self-pay | Admitting: Obstetrics & Gynecology

## 2019-01-05 NOTE — Patient Instructions (Signed)
1. Encounter for routine gynecological examination with Papanicolaou smear of cervix Normal gynecologic exam.  Pap reflex done.  Breast exam normal.  Screening mammogram negative November 2019.  Will schedule a colonoscopy at age 48.  Recommend establishing with a family physician.  2. Encounter for surveillance of injectable contraceptive Well on Depo-Provera injections.  No contraindication to continue.  Prescription sent to pharmacy.  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  4. Cigarette smoker Counseling on cigarette cessation done.  Other orders - medroxyPROGESTERone (DEPO-PROVERA) 150 MG/ML injection; Inject 1 mL (150 mg total) into the muscle every 3 (three) months.  Margaret Perez, it was a pleasure seeing you today!  I will inform you of your results as soon as they are available.

## 2019-03-27 ENCOUNTER — Telehealth: Payer: Self-pay | Admitting: *Deleted

## 2019-03-27 MED ORDER — MEDROXYPROGESTERONE ACETATE 150 MG/ML IM SUSP: 150.0000 mg | INTRAMUSCULAR | 4 refills | Status: DC

## 2019-03-27 NOTE — Telephone Encounter (Signed)
Patient called requesting depo-provera sent to [address]. Rx sent.

## 2019-04-03 ENCOUNTER — Ambulatory Visit (INDEPENDENT_AMBULATORY_CARE_PROVIDER_SITE_OTHER): Payer: No Typology Code available for payment source | Admitting: Anesthesiology

## 2019-04-03 ENCOUNTER — Other Ambulatory Visit: Payer: Self-pay

## 2019-04-03 DIAGNOSIS — Z3042 Encounter for surveillance of injectable contraceptive: Secondary | ICD-10-CM

## 2019-04-03 MED ORDER — MEDROXYPROGESTERONE ACETATE 150 MG/ML IM SUSP
150.0000 mg | Freq: Once | INTRAMUSCULAR | Status: AC
  Administered 2019-04-03: 150 mg via INTRAMUSCULAR

## 2021-05-23 DEATH — deceased
